# Patient Record
Sex: Male | Born: 1980 | Race: White | Hispanic: No | Marital: Married | Smoking: Former smoker
Health system: Southern US, Community
[De-identification: ages and names within clinical notes are randomized; demographics above are authoritative.]

## PROBLEM LIST (undated history)

## (undated) DIAGNOSIS — K219 Gastro-esophageal reflux disease without esophagitis: Secondary | ICD-10-CM

## (undated) DIAGNOSIS — Z8719 Personal history of other diseases of the digestive system: Secondary | ICD-10-CM

## (undated) HISTORY — DX: Gastro-esophageal reflux disease without esophagitis: K21.9

## (undated) HISTORY — PX: WISDOM TOOTH EXTRACTION: SHX21

---

## 2009-07-25 ENCOUNTER — Emergency Department (HOSPITAL_COMMUNITY): Admission: EM | Admit: 2009-07-25 | Discharge: 2009-07-25 | Payer: Self-pay | Admitting: Emergency Medicine

## 2009-07-25 ENCOUNTER — Encounter (INDEPENDENT_AMBULATORY_CARE_PROVIDER_SITE_OTHER): Payer: Self-pay | Admitting: *Deleted

## 2009-07-26 ENCOUNTER — Telehealth: Payer: Self-pay | Admitting: Gastroenterology

## 2009-07-27 ENCOUNTER — Ambulatory Visit: Payer: Self-pay | Admitting: Gastroenterology

## 2009-07-27 DIAGNOSIS — K219 Gastro-esophageal reflux disease without esophagitis: Secondary | ICD-10-CM

## 2009-07-27 DIAGNOSIS — R1084 Generalized abdominal pain: Secondary | ICD-10-CM | POA: Insufficient documentation

## 2009-07-27 DIAGNOSIS — R935 Abnormal findings on diagnostic imaging of other abdominal regions, including retroperitoneum: Secondary | ICD-10-CM | POA: Insufficient documentation

## 2009-07-27 DIAGNOSIS — K625 Hemorrhage of anus and rectum: Secondary | ICD-10-CM

## 2009-07-30 ENCOUNTER — Ambulatory Visit: Payer: Self-pay | Admitting: Gastroenterology

## 2009-09-07 ENCOUNTER — Ambulatory Visit: Payer: Self-pay | Admitting: Gastroenterology

## 2009-09-16 ENCOUNTER — Telehealth (INDEPENDENT_AMBULATORY_CARE_PROVIDER_SITE_OTHER): Payer: Self-pay | Admitting: *Deleted

## 2010-09-04 ENCOUNTER — Encounter: Payer: Self-pay | Admitting: Gastroenterology

## 2010-09-13 NOTE — Assessment & Plan Note (Signed)
  Review of gastrointestinal problems: 1. Intermittent abdominal pains, fall 2010. Emergency room visit: CT scan abdomen and pelvis suggested possible edema in her right colon, terminal ileum, small associated lymph nodes. White blood cell count 14,000, total bilirubin 1.7 otherwise normal labs. Colonoscopy December, 2010 showed normal terminal ileum, very mildly granular colonic mucosa however biopsies were all normal. No colitis.  January, 2010 Binge drinks, question contribution to his discomforts intermittently.    History of Present Illness Visit Type: Follow-up Visit Primary GI MD: Rob Bunting MD Primary Provider: n/a Chief Complaint: 4-5 week F/U History of Present Illness:     who since colonoscopy last month has done pretty good.  No abd pains.  NO nausea, no vomitting.  No constipation, diarrhrea or bleeding. On sunday he had some loose stools, but this was after 2-3 days of rather heavy drinking with friends in town. (8-9 beers on Frid and Sat nights).           Current Medications (verified): 1)  None  Allergies (verified): No Known Drug Allergies  Vital Signs:  Patient profile:   30 year old male Height:      76 inches Weight:      224.25 pounds BMI:     27 .40 Pulse rate:   64 / minute Pulse rhythm:   regular BP sitting:   108 / 72  (left arm) Cuff size:   regular  Vitals Entered By: June McMurray CMA Duncan Dull) (September 07, 2009 8:30 AM)  Physical Exam  Additional Exam:  Constitutional: generally well appearing Psychiatric: alert and oriented times 3 Abdomen: soft, non-tender, non-distended, normal bowel sounds    Impression & Recommendations:  Problem # 1:  Intermittent abdominal pains unclear etiology. He does binge drink, for example this past weekend he had 8-9 beers both Friday and Saturday night. Sunday he had a lot of loose stools. He did not notice any significant abdominal pains however. Perhaps this is contributing, gastritis intermittently.  Perhaps mild pancreatitis. This was certainly not evident on CT scan previously. I recommended he try cut back on his binge drinking. He will get an abdominal ultrasound to check for colon or pathology (did have slightly elevated bilirubin at his ER visit).  He will call the office at the time of his next episode of abdominal pain, this usually lasts one to 2 days. My plan would be for him to come to the office for examination, repeat labs including a CBC, complete metabolic profile, hepatic enzymes.  Patient Instructions: 1)  You will be scheduled for an ultrasound to check for gallbladder pathology. 2)  Call Dr. Christella Hartigan office if the episodic pains occur again, will plan to get you in office for examination and get repeat bloodwork (cbc, cmet, amylase, lipase) during the episode. 3)  Otherwise follow up as needed. 4)  The medication list was reviewed and reconciled.  All changed / newly prescribed medications were explained.  A complete medication list was provided to the patient / caregiver.  Appended Document: Orders Update/US    Clinical Lists Changes  Orders: Added new Test order of Ultrasound Abdomen (UAS) - Signed

## 2010-09-13 NOTE — Progress Notes (Signed)
Summary: Missed [place] report  Phone Note Outgoing Call Call back at [facility] Phone [phone]   Call placed by: Chales Abrahams CMA Duncan Dull),  September 16, 2009 10:23 AM Summary of Call: called to inquire about missed [place] appt. left message on machine to call back  Initial call taken by: Chales Abrahams CMA Duncan Dull),  September 16, 2009 10:23 AM  Follow-up for Phone Call        pt can not afford to have [place] he cx appt and does not want to reschedule.  He will call if he has any further problems. Follow-up by: Chales Abrahams CMA Duncan Dull),  September 16, 2009 10:40 AM  Additional Follow-up for Phone Call Additional follow up Details #1::        ok Additional Follow-up by: Rachael Fee MD,  September 17, 2009 7:51 AM

## 2010-11-15 LAB — URINALYSIS, ROUTINE W REFLEX MICROSCOPIC
Hgb urine dipstick: NEGATIVE
Ketones, ur: NEGATIVE mg/dL

## 2010-11-15 LAB — CBC
Hemoglobin: 14.5 g/dL (ref 13.0–17.0)
MCHC: 34.3 g/dL (ref 30.0–36.0)
RBC: 4.79 MIL/uL (ref 4.22–5.81)
RDW: 13.8 % (ref 11.5–15.5)

## 2010-11-15 LAB — COMPREHENSIVE METABOLIC PANEL
ALT: 22 U/L (ref 0–53)
Alkaline Phosphatase: 57 U/L (ref 39–117)
CO2: 24 mEq/L (ref 19–32)
Chloride: 106 mEq/L (ref 96–112)
GFR calc non Af Amer: >60 mL/min (ref >60)
Glucose, Bld: 109 mg/dL — ABNORMAL HIGH (ref 70–99)
Potassium: 3.8 mEq/L (ref 3.5–5.1)
Sodium: 139 mEq/L (ref 135–145)

## 2010-11-15 LAB — DIFFERENTIAL
Basophils Relative: 0 % (ref 0–1)
Eosinophils Absolute: 0 10*3/uL (ref 0.0–0.7)
Monocytes Relative: 1 % — ABNORMAL LOW (ref 3–12)
Neutrophils Relative %: 98 % — ABNORMAL HIGH (ref 43–77)

## 2012-08-12 ENCOUNTER — Ambulatory Visit: Payer: PRIVATE HEALTH INSURANCE | Admitting: Physician Assistant

## 2012-08-12 VITALS — BP 104/74 | HR 88 | Temp 98.4°F | Resp 18 | Wt 240.0 lb

## 2012-08-12 DIAGNOSIS — R05 Cough: Secondary | ICD-10-CM

## 2012-08-12 DIAGNOSIS — J069 Acute upper respiratory infection, unspecified: Secondary | ICD-10-CM

## 2012-08-12 LAB — POCT INFLUENZA A/B: Influenza A, POC: NEGATIVE

## 2012-08-12 MED ORDER — HYDROCOD POLST-CHLORPHEN POLST 10-8 MG/5ML PO LQCR: 5.0000 mL | Freq: Two times a day (BID) | ORAL | Status: DC | PRN

## 2012-08-12 MED ORDER — GUAIFENESIN ER 1200 MG PO TB12: 1.0000 | ORAL_TABLET | Freq: Two times a day (BID) | ORAL | Status: DC | PRN

## 2012-08-12 MED ORDER — IPRATROPIUM BROMIDE 0.03 % NA SOLN: 2.0000 | Freq: Two times a day (BID) | NASAL | Status: DC

## 2012-08-12 NOTE — Progress Notes (Signed)
  Subjective:    Patient ID: Damon Richard, male    DOB: Jul 14, 1981, 31 y.o.   MRN: [id number]  HPI This 31 y.o. male presents for evaluation of illness-"flu-like symptoms." Began 08/09/2012 and he reports that he spent 2 days in bed, but "cannot afford to be out of work." He's not able to discern whether the symptoms began suddenly or if the onset was gradual.  Initially he had a sore throat, which has since resolved. Cough, sinus pressure and drainage, blowing out "chunks" from his nose and occasionally coughing them up.  HA.  Subjective fever, chills. Mild aches.  No GI/GU symptoms.   Past Medical History  Diagnosis Date  . GERD (gastroesophageal reflux disease)     History reviewed. No pertinent past surgical history.  Prior to Admission medications   Medication Sig Start Date End Date Taking? Authorizing Provider  omeprazole (PRILOSEC) 20 MG capsule Take 20 mg by mouth daily.   Yes Historical Provider, MD    No Known Allergies  History   Social History  . Marital Status: Single    Spouse Name: Albin Felling    Number of Children: 0  . Years of Education: 16   Occupational History  . beer sales    Social History Main Topics  . Smoking status: Never Smoker   . Smokeless tobacco: Former Neurosurgeon    Types: Chew  . Alcohol Use: 0.6 - 24.0 oz/week    1-40 Cans of beer per week  . Drug Use: No  . Sexually Active: Yes -- Male partner(s)    Birth Control/ Protection: Other-see comments     Comment: partner takes birth control pills   Other Topics Concern  . Not on file   Social History Narrative   Lives with girlfriend.    Family History  Problem Relation Age of Onset  . Cancer Father 67    bladder; non-smoker    Review of Systems As above    Objective:   Physical Exam Blood pressure 104/74, pulse 88, temperature 98.4 F (36.9 C), temperature source Oral, resp. rate 18, weight 240 lb (108.863 kg). There is no height on file to calculate BMI. Well-developed, well  nourished WM who is awake, alert and oriented, in NAD. HEENT: [place]/AT, PERRL, EOMI.  Sclera and conjunctiva are clear.  EAC are patent, TMs are normal in appearance. Nasal mucosa is pink and moist, though somewhat congested. OP is clear. Neck: supple, non-tender, no lymphadenopathy, thyromegaly. Heart: RRR, no murmur Lungs: normal effort, CTA Extremities: no cyanosis, clubbing or edema. Skin: warm and dry without rash. Psychologic: good mood and appropriate affect, normal speech and behavior.  Results for orders placed in visit on 08/12/12  POCT INFLUENZA A/B      Component Value Range   Influenza A, POC Negative     Influenza B, POC Negative         Assessment & Plan:   1. Cough  chlorpheniramine-HYDROcodone (TUSSIONEX PENNKINETIC ER) 10-8 MG/5ML LQCR  2. Acute upper respiratory infections of unspecified site  POCT Influenza A/B, ipratropium (ATROVENT) 0.03 % nasal spray, Guaifenesin (MUCINEX MAXIMUM STRENGTH) 1200 MG TB12   Supportive care.  Anticipatory guidance.  RTC if symptoms worsen or persist.

## 2012-08-12 NOTE — Patient Instructions (Addendum)
Get plenty of rest and drink at least 64 ounces of water daily. 

## 2015-01-19 ENCOUNTER — Encounter: Payer: Self-pay | Admitting: Gastroenterology

## 2015-03-10 ENCOUNTER — Ambulatory Visit (INDEPENDENT_AMBULATORY_CARE_PROVIDER_SITE_OTHER): Payer: Managed Care, Other (non HMO) | Admitting: Emergency Medicine

## 2015-03-10 VITALS — BP 110/60 | HR 91 | Temp 98.1°F | Resp 18 | Ht 75.5 in | Wt 227.4 lb

## 2015-03-10 DIAGNOSIS — S335XXA Sprain of ligaments of lumbar spine, initial encounter: Secondary | ICD-10-CM

## 2015-03-10 DIAGNOSIS — M7022 Olecranon bursitis, left elbow: Secondary | ICD-10-CM

## 2015-03-10 MED ORDER — CYCLOBENZAPRINE HCL 10 MG PO TABS: 10.0000 mg | ORAL_TABLET | Freq: Three times a day (TID) | ORAL | Status: DC | PRN

## 2015-03-10 MED ORDER — CEPHALEXIN 500 MG PO CAPS: 500.0000 mg | ORAL_CAPSULE | Freq: Four times a day (QID) | ORAL | Status: DC

## 2015-03-10 MED ORDER — NAPROXEN SODIUM 550 MG PO TABS: 550.0000 mg | ORAL_TABLET | Freq: Two times a day (BID) | ORAL | Status: DC

## 2015-03-10 MED ORDER — SULFAMETHOXAZOLE-TRIMETHOPRIM 800-160 MG PO TABS: 12.0000 | ORAL_TABLET | Freq: Two times a day (BID) | ORAL | Status: DC

## 2015-03-10 MED ORDER — NAPROXEN SODIUM 550 MG PO TABS: 550.0000 mg | ORAL_TABLET | Freq: Two times a day (BID) | ORAL | Status: AC

## 2015-03-10 NOTE — Progress Notes (Signed)
Subjective:  Patient ID: Damon Richard, male    DOB: 07/16/1981  Age: 34 y.o. MRN: [id number]  CC: Elbow Swollen; Back Pain; and Prescriptions   HPI Damon Richard presents  with acutely swollen and painful red left elbow. This began last night. He has no history of overuse or injury. Said last night he was picking a scab on his elbow and now is so swollen and red and painful. He denies any fever chills no nausea or vomiting. Has had no improvement in symptoms with over-the-counter medication.  He also complains of low back pain after bending over last week and feel hearing a pop. He denies any radicular symptoms numbness tingling or weakness. No radiation of pain improvement with over-the-counter medication has no history of a back injury. Work Leisure centre manager.  History Damon Richard has a past medical history of GERD (gastroesophageal reflux disease).   He has no past surgical history on file.   His  family history includes Cancer (age of onset: 44) in his father.  He   reports that he has never smoked. He has quit using smokeless tobacco. His smokeless tobacco use included Chew. He reports that he drinks about 0.6 - 24.0 oz of alcohol per week. He reports that he does not use illicit drugs.  Outpatient Prescriptions Prior to Visit  Medication Sig Dispense Refill  . chlorpheniramine-HYDROcodone (TUSSIONEX PENNKINETIC ER) 10-8 MG/5ML LQCR Take 5 mLs by mouth every 12 (twelve) hours as needed (cough). (Patient not taking: Reported on 03/10/2015) 140 mL 0  . Guaifenesin (MUCINEX MAXIMUM STRENGTH) 1200 MG TB12 Take 1 tablet (1,200 mg total) by mouth every 12 (twelve) hours as needed. (Patient not taking: Reported on 03/10/2015) 14 tablet 1  . ipratropium (ATROVENT) 0.03 % nasal spray Place 2 sprays into the nose 2 (two) times daily. (Patient not taking: Reported on 03/10/2015) 30 mL 0  . omeprazole (PRILOSEC) 20 MG capsule Take 20 mg by mouth daily.     No facility-administered medications  prior to visit.    History   Social History  . Marital Status: Single    Spouse Name: Damon Richard  . Number of Children: 0  . Years of Education: 16   Occupational History  . beer sales    Social History Main Topics  . Smoking status: Never Smoker   . Smokeless tobacco: Former Neurosurgeon    Types: Chew  . Alcohol Use: 0.6 - 24.0 oz/week    1-40 Cans of beer per week  . Drug Use: No  . Sexual Activity:    Partners: Female    Birth Control/ Protection: Other-see comments     Comment: partner takes birth control pills   Other Topics Concern  . None   Social History Narrative   Lives with girlfriend.     Review of Systems  Constitutional: Negative for fever, chills and appetite change.  HENT: Negative for congestion, ear pain, postnasal drip, sinus pressure and sore throat.   Eyes: Negative for pain and redness.  Respiratory: Negative for cough, shortness of breath and wheezing.   Cardiovascular: Negative for leg swelling.  Gastrointestinal: Negative for nausea, vomiting, abdominal pain, diarrhea, constipation and blood in stool.  Endocrine: Negative for polyuria.  Genitourinary: Negative for dysuria, urgency, frequency and flank pain.  Musculoskeletal: Negative for gait problem.  Skin: Negative for rash.  Neurological: Negative for weakness and headaches.  Psychiatric/Behavioral: Negative for confusion and decreased concentration. The patient is not nervous/anxious.     Objective:  BP 110/60 mmHg  Pulse 91  Temp(Src) 98.1 F (36.7 C) (Oral)  Resp 18  Ht 6' 3.5" (1.918 m)  Wt 227 lb 6.4 oz (103.148 kg)  BMI 28.04 kg/m2  SpO2 98%  Physical Exam  Constitutional: He is oriented to person, place, and time. He appears well-developed and well-nourished. No distress.  HENT:  Head: Normocephalic and atraumatic.  Right Ear: External ear normal.  Left Ear: External ear normal.  Nose: Nose normal.  Eyes: Conjunctivae and EOM are normal. Pupils are equal, round, and reactive to  light. No scleral icterus.  Neck: Normal range of motion. Neck supple. No tracheal deviation present.  Cardiovascular: Normal rate, regular rhythm and normal heart sounds.   Pulmonary/Chest: Effort normal. No respiratory distress. He has no wheezes. He has no rales.  Abdominal: He exhibits no mass. There is no tenderness. There is no rebound and no guarding.  Musculoskeletal: He exhibits no edema.       Left elbow: He exhibits swelling. Tenderness found.  Lymphadenopathy:    He has no cervical adenopathy.  Neurological: He is alert and oriented to person, place, and time. Coordination normal.  Skin: Skin is warm and dry. No rash noted. There is erythema.  Psychiatric: He has a normal mood and affect. His behavior is normal.   he was noted to have moderate swelling of the left olecranon bursa. Rather red and swollen tender. Has some lymphangitis.    Assessment & Plan:   Damon Richard was seen today for elbow swollen, back pain and prescriptions.  Diagnoses and all orders for this visit:  Olecranon bursitis, left  Sprain of lumbar region, initial encounter  Other orders -     Discontinue: sulfamethoxazole-trimethoprim (BACTRIM DS,SEPTRA DS) 800-160 MG per tablet; Take 12 tablets by mouth 2 (two) times daily. -     Discontinue: cephALEXin (KEFLEX) 500 MG capsule; Take 1 capsule (500 mg total) by mouth 4 (four) times daily. -     Discontinue: naproxen sodium (ANAPROX DS) 550 MG tablet; Take 1 tablet (550 mg total) by mouth 2 (two) times daily with a meal. -     Discontinue: cyclobenzaprine (FLEXERIL) 10 MG tablet; Take 1 tablet (10 mg total) by mouth 3 (three) times daily as needed for muscle spasms. -     sulfamethoxazole-trimethoprim (BACTRIM DS,SEPTRA DS) 800-160 MG per tablet; Take 12 tablets by mouth 2 (two) times daily. -     naproxen sodium (ANAPROX DS) 550 MG tablet; Take 1 tablet (550 mg total) by mouth 2 (two) times daily with a meal. -     cyclobenzaprine (FLEXERIL) 10 MG tablet;  Take 1 tablet (10 mg total) by mouth 3 (three) times daily as needed for muscle spasms. -     cephALEXin (KEFLEX) 500 MG capsule; Take 1 capsule (500 mg total) by mouth 4 (four) times daily.   I am having [name] maintain his omeprazole, ipratropium, Guaifenesin, chlorpheniramine-HYDROcodone, sulfamethoxazole-trimethoprim, naproxen sodium, cyclobenzaprine, and cephALEXin.  Meds ordered this encounter  Medications  . DISCONTD: sulfamethoxazole-trimethoprim (BACTRIM DS,SEPTRA DS) 800-160 MG per tablet    Sig: Take 12 tablets by mouth 2 (two) times daily.    Dispense:  40 tablet    Refill:  0  . DISCONTD: cephALEXin (KEFLEX) 500 MG capsule    Sig: Take 1 capsule (500 mg total) by mouth 4 (four) times daily.    Dispense:  40 capsule    Refill:  0  . DISCONTD: naproxen sodium (ANAPROX DS) 550 MG tablet    Sig: Take 1 tablet (  550 mg total) by mouth 2 (two) times daily with a meal.    Dispense:  40 tablet    Refill:  0  . DISCONTD: cyclobenzaprine (FLEXERIL) 10 MG tablet    Sig: Take 1 tablet (10 mg total) by mouth 3 (three) times daily as needed for muscle spasms.    Dispense:  30 tablet    Refill:  0  . sulfamethoxazole-trimethoprim (BACTRIM DS,SEPTRA DS) 800-160 MG per tablet    Sig: Take 12 tablets by mouth 2 (two) times daily.    Dispense:  40 tablet    Refill:  0  . naproxen sodium (ANAPROX DS) 550 MG tablet    Sig: Take 1 tablet (550 mg total) by mouth 2 (two) times daily with a meal.    Dispense:  40 tablet    Refill:  0  . cyclobenzaprine (FLEXERIL) 10 MG tablet    Sig: Take 1 tablet (10 mg total) by mouth 3 (three) times daily as needed for muscle spasms.    Dispense:  30 tablet    Refill:  0  . cephALEXin (KEFLEX) 500 MG capsule    Sig: Take 1 capsule (500 mg total) by mouth 4 (four) times daily.    Dispense:  40 capsule    Refill:  0    Appropriate red flag conditions were discussed with the patient as well as actions that should be taken.  Patient expressed his  understanding.  Follow-up: Return if symptoms worsen or fail to improve.  Carmelina Dane, MD

## 2015-03-10 NOTE — Patient Instructions (Signed)
Lumbosacral Strain Lumbosacral strain is a strain of any of the parts that make up your lumbosacral vertebrae. Your lumbosacral vertebrae are the bones that make up the lower third of your backbone. Your lumbosacral vertebrae are held together by muscles and tough, fibrous tissue (ligaments).  CAUSES  A sudden blow to your back can cause lumbosacral strain. Also, anything that causes an excessive stretch of the muscles in the low back can cause this strain. This is typically seen when people exert themselves strenuously, fall, lift heavy objects, bend, or crouch repeatedly. RISK FACTORS  Physically demanding work.  Participation in pushing or pulling sports or sports that require a sudden twist of the back (tennis, golf, baseball).  Weight lifting.  Excessive lower back curvature.  Forward-tilted pelvis.  Weak back or abdominal muscles or both.  Tight hamstrings. SIGNS AND SYMPTOMS  Lumbosacral strain may cause pain in the area of your injury or pain that moves (radiates) down your leg.  DIAGNOSIS Your health care provider can often diagnose lumbosacral strain through a physical exam. In some cases, you may need tests such as X-ray exams.  TREATMENT  Treatment for your lower back injury depends on many factors that your clinician will have to evaluate. However, most treatment will include the use of anti-inflammatory medicines. HOME CARE INSTRUCTIONS   Avoid hard physical activities (tennis, racquetball, waterskiing) if you are not in proper physical condition for it. This may aggravate or create problems.  If you have a back problem, avoid sports requiring sudden body movements. Swimming and walking are generally safer activities.  Maintain good posture.  Maintain a healthy weight.  For acute conditions, you may put ice on the injured area.  Put ice in a plastic bag.  Place a towel between your skin and the bag.  Leave the ice on for 20 minutes, 2-3 times a day.  When the  low back starts healing, stretching and strengthening exercises may be recommended. SEEK MEDICAL CARE IF:  Your back pain is getting worse.  You experience severe back pain not relieved with medicines. SEEK IMMEDIATE MEDICAL CARE IF:   You have numbness, tingling, weakness, or problems with the use of your arms or legs.  There is a change in bowel or bladder control.  You have increasing pain in any area of the body, including your belly (abdomen).  You notice shortness of breath, dizziness, or feel faint.  You feel sick to your stomach (nauseous), are throwing up (vomiting), or become sweaty.  You notice discoloration of your toes or legs, or your feet get very cold. MAKE SURE YOU:   Understand these instructions.  Will watch your condition.  Will get help right away if you are not doing well or get worse. Document Released: 05/10/2005 Document Revised: 08/05/2013 Document Reviewed: 03/19/2013 [facility] [place] [facility] Patient Information 2015 [place], [place]. This information is not intended to replace advice given to you by your health care provider. Make sure you discuss any questions you have with your health care provider. Olecranon Bursitis Bursitis is swelling and soreness (inflammation) of a fluid-filled sac (bursa) that covers and protects a joint. Olecranon bursitis occurs over the elbow.  CAUSES Bursitis can be caused by injury, overuse of the joint, arthritis, or infection.  SYMPTOMS   Tenderness, swelling, warmth, or redness over the elbow.  Elbow pain with movement. This is greater with bending the elbow.  Squeaking sound when the bursa is rubbed or moved.  Increasing size of the bursa without pain or discomfort.  Fever  with increasing pain and swelling if the bursa becomes infected. HOME CARE INSTRUCTIONS   Put ice on the affected area.  Put ice in a plastic bag.  Place a towel between your skin and the bag.  Leave the ice on for 15-20 minutes each hour while awake.  Do this for the first 2 days.  When resting, elevate your elbow above the level of your heart. This helps reduce swelling.  Continue to put the joint through a full range of motion 4 times per day. Rest the injured joint at other times. When the pain lessens, begin normal slow movements and usual activities.  Only take over-the-counter or prescription medicines for pain, discomfort, or fever as directed by your caregiver.  Reduce your intake of milk and related dairy products (cheese, yogurt). They may make your condition worse. SEEK IMMEDIATE MEDICAL CARE IF:   Your pain increases even during treatment.  You have a fever.  You have heat and inflammation over the bursa and elbow.  You have a red line that goes up your arm.  You have pain with movement of your elbow. MAKE SURE YOU:   Understand these instructions.  Will watch your condition.  Will get help right away if you are not doing well or get worse. Document Released: 08/30/2006 Document Revised: 10/23/2011 Document Reviewed: 07/16/2007 [facility] Patient Information 2015 [place], [place]. This information is not intended to replace advice given to you by your health care provider. Make sure you discuss any questions you have with your health care provider.

## 2019-04-28 ENCOUNTER — Other Ambulatory Visit: Payer: Self-pay

## 2019-04-28 DIAGNOSIS — Z20822 Contact with and (suspected) exposure to covid-19: Secondary | ICD-10-CM

## 2019-04-29 LAB — NOVEL CORONAVIRUS, NAA: SARS-CoV-2, NAA: DETECTED — AB

## 2019-06-15 ENCOUNTER — Encounter: Payer: Self-pay | Admitting: Emergency Medicine

## 2019-06-15 ENCOUNTER — Other Ambulatory Visit: Payer: Self-pay

## 2019-06-15 ENCOUNTER — Emergency Department
Admission: EM | Admit: 2019-06-15 | Discharge: 2019-06-15 | Disposition: A | Discharge status: Home / Self Care | Payer: Commercial Managed Care - PPO | Attending: Emergency Medicine | Admitting: Emergency Medicine

## 2019-06-15 ENCOUNTER — Emergency Department: Payer: Commercial Managed Care - PPO

## 2019-06-15 DIAGNOSIS — Y929 Unspecified place or not applicable: Secondary | ICD-10-CM | POA: Diagnosis not present

## 2019-06-15 DIAGNOSIS — Z87891 Personal history of nicotine dependence: Secondary | ICD-10-CM | POA: Insufficient documentation

## 2019-06-15 DIAGNOSIS — Z79899 Other long term (current) drug therapy: Secondary | ICD-10-CM | POA: Insufficient documentation

## 2019-06-15 DIAGNOSIS — X509XXA Other and unspecified overexertion or strenuous movements or postures, initial encounter: Secondary | ICD-10-CM | POA: Insufficient documentation

## 2019-06-15 DIAGNOSIS — Y999 Unspecified external cause status: Secondary | ICD-10-CM | POA: Insufficient documentation

## 2019-06-15 DIAGNOSIS — S8992XA Unspecified injury of left lower leg, initial encounter: Secondary | ICD-10-CM | POA: Diagnosis present

## 2019-06-15 DIAGNOSIS — Y9389 Activity, other specified: Secondary | ICD-10-CM | POA: Insufficient documentation

## 2019-06-15 DIAGNOSIS — M25462 Effusion, left knee: Secondary | ICD-10-CM | POA: Diagnosis not present

## 2019-06-15 MED ORDER — IBUPROFEN 600 MG PO TABS: 600.0000 mg | ORAL_TABLET | Freq: Four times a day (QID) | ORAL | 0 refills | Status: DC | PRN

## 2019-06-15 NOTE — Discharge Instructions (Signed)
Your xray shows no fracture. You have swelling inside your joint so you may have injured your meniscus or ligament. Please wear knee brace and use crutches.  Ice and elevate knee tonight.  Take ibuprofen for pain and inflammation.  Please call orthopedics tomorrow for an appointment this week.

## 2019-06-15 NOTE — ED Notes (Signed)
Patient states he was doing some jumping and felt a pop in the left knee with immediate pain. PA in to see patient before this RN on shift.

## 2019-06-15 NOTE — ED Provider Notes (Signed)
[facility] Emergency Department Provider Note  ____________________________________________  Time seen: Approximately 7:52 PM  I have reviewed the triage vital signs and the nursing notes.   HISTORY  Chief Complaint Knee Pain    HPI Damon Richard is a 38 y.o. male that presents to the emergency department for evaluation of left knee pain after injury tonight.  Patient was doing a bet and was jumping when he heard a pop to his left knee.  He has had difficulty walking on knee since injury.  Past Medical History:  Diagnosis Date  . GERD (gastroesophageal reflux disease)     Patient Active Problem List   Diagnosis Date Noted  . GERD 07/27/2009  . RECTAL BLEEDING 07/27/2009  . ABDOMINAL PAIN -GENERALIZED 07/27/2009  . NONSPEC ABN FINDNG RAD & OTH EXAM ABDOMINAL AREA 07/27/2009    History reviewed. No pertinent surgical history.  Prior to Admission medications   Medication Sig Start Date End Date Taking? Authorizing Provider  cephALEXin (KEFLEX) 500 MG capsule Take 1 capsule (500 mg total) by mouth 4 (four) times daily. 03/10/15   Carmelina DaneAnderson, Jeffery S, MD  chlorpheniramine-HYDROcodone [facility](TUSSIONEX PENNKINETIC ER) 10-8 MG/5ML LQCR Take 5 mLs by mouth every 12 (twelve) hours as needed (cough). Patient not taking: Reported on 03/10/2015 08/12/12   Porfirio OarJeffery, Chelle, PA  cyclobenzaprine (FLEXERIL) 10 MG tablet Take 1 tablet (10 mg total) by mouth 3 (three) times daily as needed for muscle spasms. 03/10/15   Carmelina DaneAnderson, Jeffery S, MD  Guaifenesin [facility](MUCINEX MAXIMUM STRENGTH) 1200 MG TB12 Take 1 tablet (1,200 mg total) by mouth every 12 (twelve) hours as needed. Patient not taking: Reported on 03/10/2015 08/12/12   Porfirio OarJeffery, Chelle, PA  ibuprofen (ADVIL) 600 MG tablet Take 1 tablet (600 mg total) by mouth every 6 (six) hours as needed. 06/15/19   Enid DerryWagner, Nil Bolser, PA-C  ipratropium (ATROVENT) 0.03 % nasal spray Place 2 sprays into the nose 2 (two) times daily. Patient not  taking: Reported on 03/10/2015 08/12/12   Porfirio OarJeffery, Chelle, PA  omeprazole (PRILOSEC) 20 MG capsule Take 20 mg by mouth daily.    [provider]  sulfamethoxazole-trimethoprim (BACTRIM DS,SEPTRA DS) 800-160 MG per tablet Take 12 tablets by mouth 2 (two) times daily. 03/10/15   Carmelina DaneAnderson, Jeffery S, MD    Allergies Patient has no known allergies.  Family History  Problem Relation Age of Onset  . Cancer Father [id number]       bladder; non-smoker    Social History Social History   Tobacco Use  . Smoking status: Never Smoker  . Smokeless tobacco: Former NeurosurgeonUser    Types: Chew  Substance Use Topics  . Alcohol use: Yes    Alcohol/week: 1.0 - 40.0 standard drinks    Types: 1 - 40 Cans of beer per week  . Drug use: No     Review of Systems   Cardiovascular: No chest pain. Respiratory: No SOB. Gastrointestinal: No nausea, no vomiting.  Musculoskeletal: Positive for knee pain. Skin: Negative for rash, abrasions, lacerations, ecchymosis.   ____________________________________________   PHYSICAL EXAM:  VITAL SIGNS: ED Triage Vitals  Enc Vitals Group     BP 06/15/19 1807 110/76     Pulse Rate 06/15/19 1807 78     Resp 06/15/19 1807 16     Temp 06/15/19 1807 99 F (37.2 C)     Temp Source 06/15/19 1807 Oral     SpO2 06/15/19 1807 100 %     Weight 06/15/19 1809 230 lb (104.3 kg)  Height 06/15/19 1809 6\' 4"  (1.93 m)     Head Circumference --      Peak Flow --      Pain Score 06/15/19 1809 5     Pain Loc --      Pain Edu? --      Excl. in GC? --      Constitutional: Alert and oriented. Well appearing and in no acute distress. Eyes: Conjunctivae are normal. PERRL. EOMI. Head: Atraumatic. ENT:      Ears:      Nose: No congestion/rhinnorhea.      Mouth/Throat: Mucous membranes are moist.  Neck: No stridor.  Cardiovascular: Normal rate, regular rhythm.  Good peripheral circulation. Respiratory: Normal respiratory effort without tachypnea or retractions. Lungs CTAB.  Good air entry to the bases with no decreased or absent breath sounds. Musculoskeletal: Full range of motion to all extremities. No gross deformities appreciated.  Full active range of motion of left knee.  Tenderness to medial knee. Negative anterior drawer, posterior drawer, valgus, varus, mcMurray, patella apprehension, apley grind. Neurologic:  Normal speech and language. No gross focal neurologic deficits are appreciated.  Skin:  Skin is warm, dry and intact. No rash noted. Psychiatric: Mood and affect are normal. Speech and behavior are normal. Patient exhibits appropriate insight and judgement.   ____________________________________________   LABS (all labs ordered are listed, but only abnormal results are displayed)  Labs Reviewed - No data to display ____________________________________________  EKG   ____________________________________________  RADIOLOGY 13/01/20, personally viewed and evaluated these images (plain radiographs) as part of my medical decision making, as well as reviewing the written report by the radiologist.  Dg Knee Complete 4 Views Left  Result Date: 06/15/2019 CLINICAL DATA:  Initial evaluation for acute left knee pain status post injury. EXAM: LEFT KNEE - COMPLETE 4+ VIEW COMPARISON:  None. FINDINGS: No acute fracture or dislocation. Moderate effusion seen within the suprapatellar recess. Osseous mineralization normal. No discrete osseous lesions. Visualized soft tissues within normal limits. IMPRESSION: 1. Moderate joint effusion within the suprapatellar recess. 2. No acute osseous abnormality. Electronically Signed   By: 13/08/2018 M.D.   On: 06/15/2019 19:17    ____________________________________________    PROCEDURES  Procedure(s) performed:    Procedures    Medications - No data to display   ____________________________________________   INITIAL IMPRESSION / ASSESSMENT AND PLAN / ED COURSE  Pertinent labs &  imaging results that were available during my care of the patient were reviewed by me and considered in my medical decision making (see chart for details).  Review of the [place] CSRS was performed in accordance of the [facility] prior to dispensing any controlled drugs.     Patient presented to emergency department for evaluation of knee injury.  Vital signs and exam are reassuring.  Knee x-ray consistent with joint effusion.  Knee brace was placed.  Crutches were given.  Patient will be discharged home with prescriptions for Motrin. Patient is to follow up with orthopedics as directed. Patient is given ED precautions to return to the ED for any worsening or new symptoms.  Damon Richard was evaluated in Emergency Department on 06/15/2019 for the symptoms described in the history of present illness. He was evaluated in the context of the global COVID-19 pandemic, which necessitated consideration that the patient might be at risk for infection with the SARS-CoV-2 virus that causes COVID-19. Institutional protocols and algorithms that pertain to the evaluation of patients at risk for COVID-19 are in a  state of rapid change based on information released by regulatory bodies including the CDC and federal and state organizations. These policies and algorithms were followed during the patient's care in the ED.   ____________________________________________  FINAL CLINICAL IMPRESSION(S) / ED DIAGNOSES  Final diagnoses:  Effusion of left knee  Injury of left knee, initial encounter      NEW MEDICATIONS STARTED DURING THIS VISIT:  ED Discharge Orders         Ordered    ibuprofen (ADVIL) 600 MG tablet  Every 6 hours PRN     06/15/19 2042              This chart was dictated using voice recognition software/Dragon. Despite best efforts to proofread, errors can occur which can change the meaning. Any change was purely unintentional.    Laban Emperor, PA-C 06/15/19 2227    Vanessa Needmore,  MD 06/16/19 [phone]

## 2019-06-15 NOTE — ED Notes (Signed)
Knee immobilizer placed. Crutches fit to 6'3" and patient able to walk with return demonstration.

## 2019-06-15 NOTE — ED Triage Notes (Signed)
Pt to ED via POV c/o left knee pain. Pt states that he was "jumping and kicking" and landed wrong on his knee. Pt states that he heard loud "pops". Pt is unable to bear weight due to pain.

## 2019-07-04 DIAGNOSIS — M25662 Stiffness of left knee, not elsewhere classified: Secondary | ICD-10-CM | POA: Insufficient documentation

## 2019-07-04 DIAGNOSIS — M25562 Pain in left knee: Secondary | ICD-10-CM | POA: Insufficient documentation

## 2019-08-01 ENCOUNTER — Other Ambulatory Visit: Payer: Self-pay

## 2019-08-01 ENCOUNTER — Encounter
Admission: RE | Admit: 2019-08-01 | Discharge: 2019-08-01 | Disposition: A | Discharge status: Home / Self Care | Payer: Commercial Managed Care - PPO | Source: Ambulatory Visit | Attending: Orthopedic Surgery | Admitting: Orthopedic Surgery

## 2019-08-01 ENCOUNTER — Other Ambulatory Visit: Payer: Self-pay | Admitting: Orthopedic Surgery

## 2019-08-01 DIAGNOSIS — Z01818 Encounter for other preprocedural examination: Secondary | ICD-10-CM | POA: Insufficient documentation

## 2019-08-01 HISTORY — DX: Personal history of other diseases of the digestive system: Z87.19

## 2019-08-01 NOTE — Patient Instructions (Signed)
Your procedure is scheduled on: 08-14-19 THURSDAY Report to Same Day Surgery 2nd floor medical mall [facility] Entrance-take elevator on left to 2nd floor.  Check in with surgery information desk.) To find out your arrival time please call [phone] between 1PM - 3PM on 08-13-19 [facility]  Remember: Instructions that are not followed completely may result in serious medical risk, up to and including death, or upon the discretion of your surgeon and anesthesiologist your surgery may need to be rescheduled.    _x___ 1. Do not eat food after midnight the night before your procedure. NO GUM OR CANDY AFTER MIDNIGHT. You may drink clear liquids up to 2 hours before you are scheduled to arrive at the hospital for your procedure.  Do not drink clear liquids within 2 hours of your scheduled arrival to the hospital.  Clear liquids include  --Water or Apple juice without pulp  --Gatorade  --Black Coffee or Clear Tea (No milk, no creamers, do not add anything to the coffee or Tea   ____Ensure clear carbohydrate drink on the way to the hospital for bariatric patients  ____Ensure clear carbohydrate drink 3 hours before surgery.    __x__ 2. No Alcohol for 24 hours before or after surgery.   __x__3. No Smoking or e-cigarettes for 24 prior to surgery.  Do not use any chewable tobacco products for at least 6 hour prior to surgery   ____  4. Bring all medications with you on the day of surgery if instructed.    __x__ 5. Notify your doctor if there is any change in your medical condition     (cold, fever, infections).    x___6. On the morning of surgery brush your teeth with toothpaste and water.  You may rinse your mouth with mouth wash if you wish.  Do not swallow any toothpaste or mouthwash.   Do not wear jewelry, make-up, hairpins, clips or nail polish.  Do not wear lotions, powders, or perfumes.   Do not shave 48 hours prior to surgery. Men may shave face and neck.  Do not bring valuables  to the hospital.    [facility] is not responsible for any belongings or valuables.               Contacts, dentures or bridgework may not be worn into surgery.  Leave your suitcase in the car. After surgery it may be brought to your room.  For patients admitted to the hospital, discharge time is determined by your treatment team.  _  Patients discharged the day of surgery will not be allowed to drive home.  You will need someone to drive you home and stay with you the night of your procedure.    Please read over the following fact sheets that you were given:   [facility] Preparing for Surgery and or MRSA Information   _x___ TAKE THE FOLLOWING MEDICATION THE MORNING OF SURGERY WITH A SMALL SIP OF WATER. These include:  1. PRILOSEC (OMEPRAZOLE)  2. TAKE A PRILOSEC THE NIGHT BEFORE YOUR SURGERY  3.  4.  5.  6.  ____Fleets enema or Magnesium Citrate as directed.   _x___ Use CHG Soap or sage wipes as directed on instruction sheet   ____ Use inhalers on the day of surgery and bring to hospital day of surgery  ____ Stop Metformin and Janumet 2 days prior to surgery.    ____ Take 1/2 of usual insulin dose the night before surgery and none on the morning  surgery.   ____ Follow recommendations from Cardiologist, Pulmonologist or PCP regarding stopping Aspirin, Coumadin, Plavix ,Eliquis, Effient, or Pradaxa, and Pletal.  X____Stop Anti-inflammatories such as Advil, Aleve, Ibuprofen, Motrin, Naproxen, Naprosyn, Goodies powders or aspirin products 7 DAYS PRIOR TO SURGERY-OK to take Tylenol   ____ Stop supplements until after surgery.     ____ Bring C-Pap to the hospital.

## 2019-08-11 ENCOUNTER — Other Ambulatory Visit: Payer: Self-pay

## 2019-08-11 ENCOUNTER — Other Ambulatory Visit
Admission: RE | Admit: 2019-08-11 | Discharge: 2019-08-11 | Disposition: A | Discharge status: Home / Self Care | Payer: Commercial Managed Care - PPO | Source: Ambulatory Visit | Attending: Orthopedic Surgery | Admitting: Orthopedic Surgery

## 2019-08-11 DIAGNOSIS — Z01812 Encounter for preprocedural laboratory examination: Secondary | ICD-10-CM | POA: Diagnosis present

## 2019-08-11 DIAGNOSIS — Z20828 Contact with and (suspected) exposure to other viral communicable diseases: Secondary | ICD-10-CM | POA: Insufficient documentation

## 2019-08-11 LAB — CBC WITH DIFFERENTIAL/PLATELET
Abs Immature Granulocytes: 0.02 10*3/uL (ref 0.00–0.07)
Basophils Absolute: 0.1 10*3/uL (ref 0.0–0.1)
Basophils Relative: 1 %
Eosinophils Absolute: 0.5 10*3/uL (ref 0.0–0.5)
Eosinophils Relative: 7 %
HCT: 41.4 % (ref 39.0–52.0)
Hemoglobin: 13.2 g/dL (ref 13.0–17.0)
Immature Granulocytes: 0 %
Lymphocytes Relative: 28 %
Lymphs Abs: 1.8 10*3/uL (ref 0.7–4.0)
MCH: 26.6 pg (ref 26.0–34.0)
MCHC: 31.9 g/dL (ref 30.0–36.0)
MCV: 83.5 fL (ref 80.0–100.0)
Monocytes Absolute: 0.4 10*3/uL (ref 0.1–1.0)
Monocytes Relative: 7 %
Neutro Abs: 3.6 10*3/uL (ref 1.7–7.7)
Neutrophils Relative %: 57 %
Platelets: 259 10*3/uL (ref 150–400)
RBC: 4.96 MIL/uL (ref 4.22–5.81)
RDW: 14.6 % (ref 11.5–15.5)
WBC: 6.3 10*3/uL (ref 4.0–10.5)
nRBC: 0 % (ref 0.0–0.2)

## 2019-08-11 LAB — BASIC METABOLIC PANEL
Anion gap: 10 (ref 5–15)
BUN: 22 mg/dL — ABNORMAL HIGH (ref 6–20)
CO2: 25 mmol/L (ref 22–32)
Calcium: 9.1 mg/dL (ref 8.9–10.3)
Chloride: 104 mmol/L (ref 98–111)
Creatinine, Ser: 1.13 mg/dL (ref 0.61–1.24)
GFR calc Af Amer: >60 mL/min (ref >60)
GFR calc non Af Amer: >60 mL/min (ref >60)
Glucose, Bld: 97 mg/dL (ref 70–99)
Potassium: 4.2 mmol/L (ref 3.5–5.1)
Sodium: 139 mmol/L (ref 135–145)

## 2019-08-11 LAB — APTT: aPTT: 28 seconds (ref 24–36)

## 2019-08-11 LAB — PROTIME-INR
INR: 1 (ref 0.8–1.2)
Prothrombin Time: 12.9 seconds (ref 11.4–15.2)

## 2019-08-12 LAB — SARS CORONAVIRUS 2 (TAT 6-24 HRS): SARS Coronavirus 2: NEGATIVE

## 2019-08-13 MED ORDER — CEFAZOLIN SODIUM-DEXTROSE 2-4 GM/100ML-% IV SOLN
2.0000 g | INTRAVENOUS | Status: AC
  Administered 2019-08-14: 14:00:00 2 g via INTRAVENOUS

## 2019-08-13 MED ORDER — METRONIDAZOLE IN NACL 5-0.79 MG/ML-% IV SOLN
500.0000 mg | INTRAVENOUS | Status: AC
  Administered 2019-08-14: 14:00:00 500 mg via INTRAVENOUS
  Filled 2019-08-13: qty 100

## 2019-08-14 ENCOUNTER — Other Ambulatory Visit: Payer: Self-pay

## 2019-08-14 ENCOUNTER — Ambulatory Visit
Admission: RE | Admit: 2019-08-14 | Discharge: 2019-08-14 | Disposition: A | Discharge status: Home / Self Care | Payer: Commercial Managed Care - PPO | Source: Ambulatory Visit | Attending: Orthopedic Surgery | Admitting: Orthopedic Surgery

## 2019-08-14 ENCOUNTER — Ambulatory Visit: Payer: Commercial Managed Care - PPO | Admitting: Anesthesiology

## 2019-08-14 ENCOUNTER — Encounter
Admission: RE | Disposition: A | Discharge status: Home / Self Care | Payer: Self-pay | Source: Ambulatory Visit | Attending: Orthopedic Surgery

## 2019-08-14 ENCOUNTER — Encounter: Payer: Self-pay | Admitting: Orthopedic Surgery

## 2019-08-14 DIAGNOSIS — K219 Gastro-esophageal reflux disease without esophagitis: Secondary | ICD-10-CM | POA: Diagnosis not present

## 2019-08-14 DIAGNOSIS — S83512A Sprain of anterior cruciate ligament of left knee, initial encounter: Secondary | ICD-10-CM | POA: Diagnosis present

## 2019-08-14 DIAGNOSIS — S83242A Other tear of medial meniscus, current injury, left knee, initial encounter: Secondary | ICD-10-CM | POA: Insufficient documentation

## 2019-08-14 DIAGNOSIS — X58XXXA Exposure to other specified factors, initial encounter: Secondary | ICD-10-CM | POA: Diagnosis not present

## 2019-08-14 DIAGNOSIS — Z87891 Personal history of nicotine dependence: Secondary | ICD-10-CM | POA: Diagnosis not present

## 2019-08-14 DIAGNOSIS — M659 Synovitis and tenosynovitis, unspecified: Secondary | ICD-10-CM | POA: Diagnosis not present

## 2019-08-14 HISTORY — PX: ANTERIOR CRUCIATE LIGAMENT REPAIR: SHX115

## 2019-08-14 SURGERY — RECONSTRUCTION, KNEE, ACL
Anesthesia: General | Site: Knee | Laterality: Left

## 2019-08-14 MED ORDER — MIDAZOLAM HCL 2 MG/2ML IJ SOLN
INTRAMUSCULAR | Status: AC
  Filled 2019-08-14: qty 2

## 2019-08-14 MED ORDER — DEXAMETHASONE SODIUM PHOSPHATE 10 MG/ML IJ SOLN
INTRAMUSCULAR | Status: AC
  Filled 2019-08-14: qty 1

## 2019-08-14 MED ORDER — DEXAMETHASONE SODIUM PHOSPHATE 10 MG/ML IJ SOLN
INTRAMUSCULAR | Status: DC | PRN
  Administered 2019-08-14: 10 mg via INTRAVENOUS

## 2019-08-14 MED ORDER — SUGAMMADEX SODIUM 200 MG/2ML IV SOLN
INTRAVENOUS | Status: AC
  Filled 2019-08-14: qty 2

## 2019-08-14 MED ORDER — EPINEPHRINE PF 1 MG/ML IJ SOLN
INTRAMUSCULAR | Status: AC
  Filled 2019-08-14: qty 4

## 2019-08-14 MED ORDER — BUPIVACAINE HCL (PF) 0.25 % IJ SOLN
INTRAMUSCULAR | Status: DC | PRN
  Administered 2019-08-14: 30 mL

## 2019-08-14 MED ORDER — EPINEPHRINE PF 1 MG/ML IJ SOLN
INTRAMUSCULAR | Status: DC | PRN
  Administered 2019-08-14: 4 mL

## 2019-08-14 MED ORDER — SUCCINYLCHOLINE CHLORIDE 20 MG/ML IJ SOLN
INTRAMUSCULAR | Status: DC | PRN
  Administered 2019-08-14: 100 mg via INTRAVENOUS

## 2019-08-14 MED ORDER — ACETAMINOPHEN 10 MG/ML IV SOLN
INTRAVENOUS | Status: DC | PRN
  Administered 2019-08-14: 1000 mg via INTRAVENOUS

## 2019-08-14 MED ORDER — PROPOFOL 10 MG/ML IV BOLUS
INTRAVENOUS | Status: DC | PRN
  Administered 2019-08-14: 200 mg via INTRAVENOUS

## 2019-08-14 MED ORDER — ACETAMINOPHEN 10 MG/ML IV SOLN
INTRAVENOUS | Status: AC
  Filled 2019-08-14: qty 100

## 2019-08-14 MED ORDER — FENTANYL CITRATE (PF) 100 MCG/2ML IJ SOLN
INTRAMUSCULAR | Status: AC
  Administered 2019-08-14: 50 ug via INTRAVENOUS
  Filled 2019-08-14: qty 2

## 2019-08-14 MED ORDER — MIDAZOLAM HCL 2 MG/2ML IJ SOLN
INTRAMUSCULAR | Status: DC | PRN
  Administered 2019-08-14: 2 mg via INTRAVENOUS

## 2019-08-14 MED ORDER — CEFAZOLIN SODIUM-DEXTROSE 2-4 GM/100ML-% IV SOLN
INTRAVENOUS | Status: AC
  Filled 2019-08-14: qty 100

## 2019-08-14 MED ORDER — ONDANSETRON HCL 4 MG/2ML IJ SOLN
INTRAMUSCULAR | Status: AC
  Administered 2019-08-14: 4 mg via INTRAVENOUS
  Filled 2019-08-14: qty 2

## 2019-08-14 MED ORDER — LACTATED RINGERS IV SOLN: INTRAVENOUS | Status: DC

## 2019-08-14 MED ORDER — OXYCODONE HCL 5 MG PO TABS
ORAL_TABLET | ORAL | Status: AC
  Administered 2019-08-14: 5 mg via ORAL
  Filled 2019-08-14: qty 1

## 2019-08-14 MED ORDER — CHLORHEXIDINE GLUCONATE CLOTH 2 % EX PADS
6.0000 | MEDICATED_PAD | Freq: Once | CUTANEOUS | Status: AC
  Administered 2019-08-14: 6 via TOPICAL

## 2019-08-14 MED ORDER — ROCURONIUM BROMIDE 50 MG/5ML IV SOLN
INTRAVENOUS | Status: AC
  Filled 2019-08-14: qty 1

## 2019-08-14 MED ORDER — GLYCOPYRROLATE 0.2 MG/ML IJ SOLN
INTRAMUSCULAR | Status: AC
  Filled 2019-08-14: qty 1

## 2019-08-14 MED ORDER — SUGAMMADEX SODIUM 200 MG/2ML IV SOLN
INTRAVENOUS | Status: DC | PRN
  Administered 2019-08-14: 200 mg via INTRAVENOUS

## 2019-08-14 MED ORDER — OXYCODONE HCL 5 MG/5ML PO SOLN: 5.0000 mg | Freq: Once | ORAL | Status: AC | PRN

## 2019-08-14 MED ORDER — ROCURONIUM BROMIDE 100 MG/10ML IV SOLN
INTRAVENOUS | Status: DC | PRN
  Administered 2019-08-14: 5 mg via INTRAVENOUS
  Administered 2019-08-14: 45 mg via INTRAVENOUS

## 2019-08-14 MED ORDER — OXYCODONE HCL 5 MG PO TABS: 5.0000 mg | ORAL_TABLET | ORAL | 0 refills | Status: DC | PRN

## 2019-08-14 MED ORDER — HYDROMORPHONE HCL 1 MG/ML IJ SOLN
INTRAMUSCULAR | Status: AC
  Filled 2019-08-14: qty 1

## 2019-08-14 MED ORDER — LIDOCAINE HCL (PF) 2 % IJ SOLN
INTRAMUSCULAR | Status: AC
  Filled 2019-08-14: qty 10

## 2019-08-14 MED ORDER — ONDANSETRON HCL 4 MG/2ML IJ SOLN: 4.0000 mg | Freq: Once | INTRAMUSCULAR | Status: AC

## 2019-08-14 MED ORDER — LIDOCAINE HCL (CARDIAC) PF 100 MG/5ML IV SOSY
PREFILLED_SYRINGE | INTRAVENOUS | Status: DC | PRN
  Administered 2019-08-14: 100 mg via INTRAVENOUS

## 2019-08-14 MED ORDER — NEOMYCIN-POLYMYXIN B GU 40-200000 IR SOLN
Status: AC
  Filled 2019-08-14: qty 20

## 2019-08-14 MED ORDER — ONDANSETRON HCL 4 MG PO TABS: 4.0000 mg | ORAL_TABLET | Freq: Three times a day (TID) | ORAL | 0 refills | Status: DC | PRN

## 2019-08-14 MED ORDER — NEOMYCIN-POLYMYXIN B GU 40-200000 IR SOLN
Status: DC | PRN
  Administered 2019-08-14: 4 mL

## 2019-08-14 MED ORDER — ONDANSETRON HCL 4 MG/2ML IJ SOLN
INTRAMUSCULAR | Status: DC | PRN
  Administered 2019-08-14: 4 mg via INTRAVENOUS

## 2019-08-14 MED ORDER — OXYCODONE HCL 5 MG PO TABS: 5.0000 mg | ORAL_TABLET | Freq: Once | ORAL | Status: AC | PRN

## 2019-08-14 MED ORDER — ONDANSETRON HCL 4 MG/2ML IJ SOLN
INTRAMUSCULAR | Status: AC
  Filled 2019-08-14: qty 2

## 2019-08-14 MED ORDER — FENTANYL CITRATE (PF) 100 MCG/2ML IJ SOLN
25.0000 ug | INTRAMUSCULAR | Status: DC | PRN
  Administered 2019-08-14: 18:00:00 50 ug via INTRAVENOUS

## 2019-08-14 MED ORDER — ASPIRIN EC 325 MG PO TBEC: 325.0000 mg | DELAYED_RELEASE_TABLET | Freq: Every day | ORAL | 0 refills | Status: DC

## 2019-08-14 MED ORDER — FENTANYL CITRATE (PF) 100 MCG/2ML IJ SOLN
INTRAMUSCULAR | Status: DC | PRN
  Administered 2019-08-14 (×2): 50 ug via INTRAVENOUS

## 2019-08-14 MED ORDER — LIDOCAINE HCL 1 % IJ SOLN
INTRAMUSCULAR | Status: DC | PRN
  Administered 2019-08-14: 5 mL

## 2019-08-14 MED ORDER — HYDROMORPHONE HCL 1 MG/ML IJ SOLN
INTRAMUSCULAR | Status: DC | PRN
  Administered 2019-08-14: .5 mg via INTRAVENOUS
  Administered 2019-08-14 (×2): .25 mg via INTRAVENOUS

## 2019-08-14 MED ORDER — FENTANYL CITRATE (PF) 100 MCG/2ML IJ SOLN
INTRAMUSCULAR | Status: AC
  Filled 2019-08-14: qty 2

## 2019-08-14 MED ORDER — PROPOFOL 10 MG/ML IV BOLUS
INTRAVENOUS | Status: AC
  Filled 2019-08-14: qty 20

## 2019-08-14 SURGICAL SUPPLY — 105 items
ADAPTER IRRIG TUBE 2 SPIKE SOL (ADAPTER) ×4 IMPLANT
ANCHOR PEEK 4.75X19.1 SWLK C (Anchor) ×2 IMPLANT
ANCHOR SUPER #2 ORTHOCORD (MISCELLANEOUS) IMPLANT
ARTHREX GRAFTPRO LOANER FEE (INSTRUMENTS) ×2
BASIN GRAD PLASTIC 32OZ STRL (MISCELLANEOUS) ×2 IMPLANT
BIT DRILL PIN RETRO (DRILL) ×1 IMPLANT
BLADE SURG 15 STRL LF DISP TIS (BLADE) ×1 IMPLANT
BLADE SURG 15 STRL SS (BLADE) ×1
BLADE SURG SZ11 CARB STEEL (BLADE) ×2 IMPLANT
BNDG COHESIVE 4X5 TAN STRL (GAUZE/BANDAGES/DRESSINGS) ×2 IMPLANT
BNDG COHESIVE 6X5 TAN STRL LF (GAUZE/BANDAGES/DRESSINGS) ×2 IMPLANT
BNDG ESMARK 6X12 TAN STRL LF (GAUZE/BANDAGES/DRESSINGS) IMPLANT
BUR RADIUS 3.5 (BURR) ×2 IMPLANT
BUR RADIUS 4.0X18.5 (BURR) ×2 IMPLANT
BUR RADIUS 5.5 (BURR) ×2 IMPLANT
CLEANER CAUTERY TIP 5X5 PAD (MISCELLANEOUS) ×1 IMPLANT
COOLER POLAR GLACIER W/PUMP (MISCELLANEOUS) ×2 IMPLANT
COVER BACK TABLE REUSABLE LG (DRAPES) ×2 IMPLANT
COVER WAND RF STERILE (DRAPES) ×2 IMPLANT
CUFF TOURN SGL QUICK 24 (TOURNIQUET CUFF)
CUFF TOURN SGL QUICK 30 (TOURNIQUET CUFF) ×1
CUFF TRNQT CYL 24X4X16.5-23 (TOURNIQUET CUFF) IMPLANT
CUFF TRNQT CYL 30X4X21-28X (TOURNIQUET CUFF) ×1 IMPLANT
CUTTER DUAL RETRO 10.5 (CUTTER) ×2 IMPLANT
DRAPE 3/4 80X56 (DRAPES) ×4 IMPLANT
DRAPE FLUOR MINI C-ARM 54X84 (DRAPES) ×2 IMPLANT
DRAPE IMP U-DRAPE 54X76 (DRAPES) ×4 IMPLANT
DRAPE INCISE IOBAN 66X45 STRL (DRAPES) IMPLANT
DRAPE POUCH INSTRU U-SHP 10X18 (DRAPES) ×2 IMPLANT
DRAPE U-SHAPE 47X51 STRL (DRAPES) ×2 IMPLANT
DRILL FLIPCUTTER II 10.5MM (CUTTER) ×1 IMPLANT
DRILL PIN RETRO (DRILL) ×2
DURAPREP 26ML APPLICATOR (WOUND CARE) ×6 IMPLANT
ELECT REM PT RETURN 9FT ADLT (ELECTROSURGICAL) ×2
ELECTRODE REM PT RTRN 9FT ADLT (ELECTROSURGICAL) ×1 IMPLANT
FASTFIX NDL DEL SYS 360 STRT (Miscellaneous) ×2 IMPLANT
FEE LOADER GRAFTPRO ARTHREX (INSTRUMENTS) ×1 IMPLANT
FLIPCUTTER II 10.5MM (CUTTER) ×2
GAUZE SPONGE 4X4 12PLY STRL (GAUZE/BANDAGES/DRESSINGS) ×2 IMPLANT
GAUZE XEROFORM 1X8 LF (GAUZE/BANDAGES/DRESSINGS) ×2 IMPLANT
GLOVE BIOGEL PI IND STRL 9 (GLOVE) ×1 IMPLANT
GLOVE BIOGEL PI INDICATOR 9 (GLOVE) ×1
GLOVE SURG 9.0 ORTHO LTXF (GLOVE) ×4 IMPLANT
GOWN STRL REUS TWL 2XL XL LVL4 (GOWN DISPOSABLE) ×2 IMPLANT
GOWN STRL REUS W/ TWL LRG LVL3 (GOWN DISPOSABLE) ×1 IMPLANT
GOWN STRL REUS W/TWL LRG LVL3 (GOWN DISPOSABLE) ×1
GRADUATE 1200CC STRL 31836 (MISCELLANEOUS) ×2 IMPLANT
GUIDEWIRE 1.2MMX18 (WIRE) ×2 IMPLANT
HANDLE YANKAUER SUCT BULB TIP (MISCELLANEOUS) ×2 IMPLANT
IMP SYS 2ND FIX PEEK 4.75X19.1 (Miscellaneous) ×2 IMPLANT
IMPL SYS 2ND FX PEEK 4.75X19.1 (Miscellaneous) ×1 IMPLANT
IMPL TIGHTROP FIBERTAG ACL (Orthopedic Implant) ×1 IMPLANT
IMPLANT TIGHTROPE FIBERTAG ACL (Orthopedic Implant) ×2 IMPLANT
IV LACTATED RINGER IRRG 3000ML (IV SOLUTION) ×12
IV LR IRRIG 3000ML ARTHROMATIC (IV SOLUTION) ×12 IMPLANT
KIT TRANSTIBIAL (DISPOSABLE) ×2 IMPLANT
KIT TURNOVER KIT A (KITS) ×2 IMPLANT
LABEL OR SOLS (LABEL) ×2 IMPLANT
MANIFOLD NEPTUNE II (INSTRUMENTS) ×2 IMPLANT
MAT ABSORB  FLUID 56X50 GRAY (MISCELLANEOUS) ×2
MAT ABSORB FLUID 56X50 GRAY (MISCELLANEOUS) ×2 IMPLANT
NDL SAFETY ECLIPSE 18X1.5 (NEEDLE) ×1 IMPLANT
NEEDLE FILTER BLUNT 18X 1/2SAF (NEEDLE) ×1
NEEDLE FILTER BLUNT 18X1 1/2 (NEEDLE) ×1 IMPLANT
NEEDLE HYPO 18GX1.5 SHARP (NEEDLE) ×1
NEEDLE HYPO 22GX1.5 SAFETY (NEEDLE) ×2 IMPLANT
PACK ARTHROSCOPY KNEE (MISCELLANEOUS) ×2 IMPLANT
PAD ABD DERMACEA PRESS 5X9 (GAUZE/BANDAGES/DRESSINGS) ×4 IMPLANT
PAD CLEANER CAUTERY TIP 5X5 (MISCELLANEOUS) ×1
PAD WRAPON POLAR KNEE (MISCELLANEOUS) ×1 IMPLANT
PENCIL ELECTRO HAND CTR (MISCELLANEOUS) ×2 IMPLANT
PUSHER KNOT ARTHRO STRT FASTFI (MISCELLANEOUS) ×2 IMPLANT
RETROCONST DRILL SET LOANER (INSTRUMENTS) ×2
SCREW INTERFERENCE CANN 11X28M (Screw) ×2 IMPLANT
SET DRILL RETROCONST  LOANER (INSTRUMENTS) ×1 IMPLANT
SET TUBE SUCT SHAVER OUTFL 24K (TUBING) ×2 IMPLANT
SET TUBE TIP INTRA-ARTICULAR (MISCELLANEOUS) ×2 IMPLANT
SPONGE LAP 18X18 RF (DISPOSABLE) ×2 IMPLANT
STRIP CLOSURE SKIN 1/2X4 (GAUZE/BANDAGES/DRESSINGS) ×4 IMPLANT
SUCTION FRAZIER HANDLE 10FR (MISCELLANEOUS) ×1
SUCTION TUBE FRAZIER 10FR DISP (MISCELLANEOUS) ×1 IMPLANT
SUT 2 FIBERLOOP 20 STRT BLUE (SUTURE) ×4
SUT ETHILON 4-0 (SUTURE) ×2
SUT ETHILON 4-0 FS2 18XMFL BLK (SUTURE) ×2
SUT FIBERWIRE #2 38 BLUE 1/2 (SUTURE) ×4
SUT FIBERWIRE #2 38 T-5 BLUE (SUTURE) ×4
SUT MNCRL AB 4-0 PS2 18 (SUTURE) ×4 IMPLANT
SUT ORTHOCORD 2X36 W/O NDL (SUTURE) IMPLANT
SUT VIC AB 0 CT1 36 (SUTURE) ×2 IMPLANT
SUT VIC AB 2-0 CT2 27 (SUTURE) ×2 IMPLANT
SUT VIC AB 2-0 SH 27 (SUTURE) ×1
SUT VIC AB 2-0 SH 27XBRD (SUTURE) ×1 IMPLANT
SUTURE 2 FIBERLOOP 20 STRT BLU (SUTURE) ×2 IMPLANT
SUTURE ETHLN 4-0 FS2 18XMF BLK (SUTURE) ×2 IMPLANT
SUTURE FIBERWR #2 38 BLUE 1/2 (SUTURE) ×2 IMPLANT
SUTURE FIBERWR #2 38 T-5 BLUE (SUTURE) ×2 IMPLANT
SYR 10ML LL (SYRINGE) ×4 IMPLANT
SYR BULB IRRIG 60ML STRL (SYRINGE) ×2 IMPLANT
SYSTEM NDL DEL FSTFX  360 STRT (Miscellaneous) ×1 IMPLANT
TAPE UMBIL 1/8X18 RADIOPA (MISCELLANEOUS) ×2 IMPLANT
TOWEL OR 17X26 4PK STRL BLUE (TOWEL DISPOSABLE) ×4 IMPLANT
TUBING ARTHRO INFLOW-ONLY STRL (TUBING) ×2 IMPLANT
TUBING CONNECTING 10 (TUBING) IMPLANT
WAND HAND CNTRL MULTIVAC 90 (MISCELLANEOUS) ×2 IMPLANT
WRAPON POLAR PAD KNEE (MISCELLANEOUS) ×2

## 2019-08-14 NOTE — H&P (Signed)
PREOPERATIVE H&P  Chief Complaint: S83.512A sprain of anterior cruciate ligament of left knee S83.412A Sprain of medial collateral ligament of left knee S83.282D other tear of lateral meniscus current injury left knee S83.242D other tear of medial meniscus current injury left knee  HPI: Damon Richard is a 38 y.o. male who presents for preoperative history and physical with a diagnosis of S83.512A sprain of anterior cruciate ligament of left knee S83.412A Sprain of medial collateral ligament of left knee S83.282D other tear of lateral meniscus current injury left knee S83.242D other tear of medial meniscus current injury left knee. Symptoms of left knee pain, swelling and instability are significantly impairing activities of daily living.  Patient has MRI confirmation of the torn ACL with low-grade sprains of the MCL and LCL.  Patient has possible medial lateral meniscal tears as well.  Given his young age and her activity level he has elected to proceed with surgical reconstruction of his anterior cruciate ligament.    Past Medical History:  Diagnosis Date  . GERD (gastroesophageal reflux disease)   . History of hiatal hernia    Past Surgical History:  Procedure Laterality Date  . WISDOM TOOTH EXTRACTION     Social History   Socioeconomic History  . Marital status: Married    Spouse name: Albin Felling  . Number of children: 0  . Years of education: 26  . Highest education level: Not on file  Occupational History  . Occupation: beer sales  Tobacco Use  . Smoking status: Former Games developer  . Smokeless tobacco: Former Neurosurgeon    Types: Chew  Substance and Sexual Activity  . Alcohol use: Yes    Alcohol/week: 1.0 - 40.0 standard drinks    Types: 1 - 40 Cans of beer per week    Comment: occ  . Drug use: No  . Sexual activity: Yes    Partners: Female    Birth control/protection: Other-see comments    Comment: partner takes birth control pills  Other Topics Concern  . Not on file  Social  History Narrative   Lives with girlfriend.   Social Determinants of Health   Financial Resource Strain:   . Difficulty of Paying Living Expenses: Not on file  Food Insecurity:   . Worried About Programme researcher, broadcasting/film/video in the Last Year: Not on file  . Ran Out of Food in the Last Year: Not on file  Transportation Needs:   . Lack of Transportation (Medical): Not on file  . Lack of Transportation (Non-Medical): Not on file  Physical Activity:   . Days of Exercise per Week: Not on file  . Minutes of Exercise per Session: Not on file  Stress:   . Feeling of Stress : Not on file  Social Connections:   . Frequency of Communication with Friends and Family: Not on file  . Frequency of Social Gatherings with Friends and Family: Not on file  . Attends Religious Services: Not on file  . Active Member of Clubs or Organizations: Not on file  . Attends Banker Meetings: Not on file  . Marital Status: Not on file   Family History  Problem Relation Age of Onset  . Cancer Father 41       bladder; non-smoker   Allergies  Allergen Reactions  . Cephalexin Rash    Had been on Keflex and Bactrim at the same time, developed a rash Had been on Keflex and Bactrim at the same time, developed a rash   . Sulfamethoxazole-Trimethoprim  Rash    Patient on Keflex and Bactrim at the same time, developed rash Patient on Keflex and Bactrim at the same time, developed rash    Prior to Admission medications   Medication Sig Start Date End Date Taking? Authorizing Provider  Cyanocobalamin (VITAMIN B 12 PO) Take 1 tablet by mouth as needed. TAKES SPORADICALLY   Yes [provider]  ibuprofen (ADVIL) 600 MG tablet Take 1 tablet (600 mg total) by mouth every 6 (six) hours as needed. 06/15/19  Yes Laban Emperor, PA-C  omeprazole (PRILOSEC) 20 MG capsule Take 20 mg by mouth as needed.    Yes [provider]  VITAMIN D PO Take 1 tablet by mouth as needed. TAKES SPORADICALLY   Yes  [provider]  cephALEXin (KEFLEX) 500 MG capsule Take 1 capsule (500 mg total) by mouth 4 (four) times daily. Patient not taking: Reported on 07/28/2019 03/10/15   Roselee Culver, MD  chlorpheniramine-HYDROcodone [facility] PENNKINETIC ER) 10-8 MG/5ML [facility] Take 5 mLs by mouth every 12 (twelve) hours as needed (cough). Patient not taking: Reported on 03/10/2015 08/12/12   Harrison Mons, PA  cyclobenzaprine (FLEXERIL) 10 MG tablet Take 1 tablet (10 mg total) by mouth 3 (three) times daily as needed for muscle spasms. Patient not taking: Reported on 07/28/2019 03/10/15   Roselee Culver, MD  Guaifenesin [facility] MAXIMUM STRENGTH) 1200 MG TB12 Take 1 tablet (1,200 mg total) by mouth every 12 (twelve) hours as needed. Patient not taking: Reported on 03/10/2015 08/12/12   Harrison Mons, PA  ipratropium (ATROVENT) 0.03 % nasal spray Place 2 sprays into the nose 2 (two) times daily. Patient not taking: Reported on 03/10/2015 08/12/12   Harrison Mons, PA  sulfamethoxazole-trimethoprim (BACTRIM DS,SEPTRA DS) 800-160 MG per tablet Take 12 tablets by mouth 2 (two) times daily. Patient not taking: Reported on 07/28/2019 03/10/15   Roselee Culver, MD     Positive ROS: All other systems have been reviewed and were otherwise negative with the exception of those mentioned in the HPI and as above.  Physical Exam: General: Alert, no acute distress Cardiovascular: Regular rate and rhythm, no murmurs rubs or gallops.  No pedal edema Respiratory: Clear to auscultation bilaterally, no wheezes rales or rhonchi. No cyanosis, no use of accessory musculature GI: No organomegaly, abdomen is soft and non-tender nondistended with positive bowel sounds. Skin: Skin intact, no lesions within the operative field. Neurologic: Sensation intact distally Psychiatric: Patient is competent for consent with normal mood and affect Lymphatic: No cervical lymphadenopathy  MUSCULOSKELETAL: Left knee:  Patient skin is intact.  He has a trace effusion without erythema or ecchymosis.  Patient has palpable pedal pulses, intact sensation intact and intact motor function.  Patient has increased laxity with Lachman's and anterior drawer testing.  He has no instability to varus or valgus stress testing.  Patient's range of motion is from near full extension to approximately 110 to 120 degrees of flexion.  Assessment: S83.512A sprain of anterior cruciate ligament of left knee S83.412A Sprain of medial collateral ligament of left knee S83.282D other tear of lateral meniscus current injury left knee S83.242D other tear of medial meniscus current injury left knee  Plan: Plan for Procedure(s): LEFT KNEE ARTHROSCOPIC ASSISTED RECONSTRUCTION OF THE ANTERIOR CRUCIATE LIGAMENT AND POSSIBLE MENISCUS REPAIR VS PARTIAL MENISCECTOMY  I reviewed with the patient the details of the operation as well as the postoperative course.  I discussed the risks and benefits of surgery. The risks include but are not limited to  infection, bleeding, nerve or blood vessel injury, joint stiffness or loss of motion, persistent pain, weakness or instability, retear of the ACL and hardware failure and the need for further surgery. Medical risks include but are not limited to DVT and pulmonary embolism, myocardial infarction, stroke, pneumonia, respiratory failure and death. Patient understood these risks and wished to proceed.     Juanell FairlyKevin Delma Villalva, MD   08/14/2019 1:13 PM

## 2019-08-14 NOTE — Anesthesia Preprocedure Evaluation (Signed)
Anesthesia Evaluation  Patient identified by MRN, date of birth, ID band Patient awake    Reviewed: Allergy & Precautions, H&P , NPO status , Patient's Chart, lab work & pertinent test results  History of Anesthesia Complications Negative for: history of anesthetic complications  Airway Mallampati: II  TM Distance: >3 FB Neck ROM: full    Dental  (+) Chipped, Poor Dentition   Pulmonary neg pulmonary ROS, neg shortness of breath, former smoker,           Cardiovascular Exercise Tolerance: Good (-) angina(-) Past MI and (-) DOE negative cardio ROS       Neuro/Psych negative neurological ROS  negative psych ROS   GI/Hepatic Neg liver ROS, hiatal hernia, GERD  Medicated and Controlled,  Endo/Other  negative endocrine ROS  Renal/GU      Musculoskeletal   Abdominal   Peds  Hematology negative hematology ROS (+)   Anesthesia Other Findings Past Medical History: No date: GERD (gastroesophageal reflux disease) No date: History of hiatal hernia  Past Surgical History: No date: WISDOM TOOTH EXTRACTION  BMI    Body Mass Index: 27.39 kg/m      Reproductive/Obstetrics negative OB ROS                             Anesthesia Physical Anesthesia Plan  ASA: II  Anesthesia Plan: General ETT   Post-op Pain Management:    Induction: Intravenous  PONV Risk Score and Plan: Ondansetron, Dexamethasone, Midazolam and Treatment may vary due to age or medical condition  Airway Management Planned: Oral ETT  Additional Equipment:   Intra-op Plan:   Post-operative Plan: Extubation in OR  Informed Consent: I have reviewed the patients History and Physical, chart, labs and discussed the procedure including the risks, benefits and alternatives for the proposed anesthesia with the patient or authorized representative who has indicated his/her understanding and acceptance.     Dental Advisory  Given  Plan Discussed with: Anesthesiologist, CRNA and Surgeon  Anesthesia Plan Comments: (Patient consented for risks of anesthesia including but not limited to:  - adverse reactions to medications - damage to teeth, lips or other oral mucosa - sore throat or hoarseness - Damage to heart, brain, lungs or loss of life  Patient voiced understanding.)        Anesthesia Quick Evaluation

## 2019-08-14 NOTE — Anesthesia Postprocedure Evaluation (Signed)
Anesthesia Post Note  Patient: Joshau Stahlman  Procedure(s) Performed: RECONSTRUCTION ANTERIOR CRUCIATE LIGAMENT (ACL) (Left Knee)  Patient location during evaluation: PACU Anesthesia Type: General Level of consciousness: awake and alert Pain management: pain level controlled Vital Signs Assessment: post-procedure vital signs reviewed and stable Respiratory status: spontaneous breathing, nonlabored ventilation, respiratory function stable and patient connected to nasal cannula oxygen Cardiovascular status: blood pressure returned to baseline and stable Postop Assessment: no apparent nausea or vomiting Anesthetic complications: no     Last Vitals:  Vitals:   08/14/19 1806 08/14/19 1821  BP: 116/76 122/80  Pulse: 86 84  Resp: 15 19  Temp:  (!) 36.2 C  SpO2: 96% 96%    Last Pain:  Vitals:   08/14/19 1821  TempSrc:   PainSc: 4                  Martha Clan

## 2019-08-14 NOTE — Anesthesia Procedure Notes (Signed)
Procedure Name: Intubation Date/Time: 08/14/2019 1:29 PM Performed by: Johnna Acosta, CRNA Pre-anesthesia Checklist: Patient identified, Emergency Drugs available, Suction available, Patient being monitored and Timeout performed Patient Re-evaluated:Patient Re-evaluated prior to induction Oxygen Delivery Method: Circle system utilized Preoxygenation: Pre-oxygenation with 100% oxygen Induction Type: IV induction and Rapid sequence Laryngoscope Size: McGraph and 4 Grade View: Grade I Tube type: Oral Tube size: 8.0 mm Number of attempts: 1 Airway Equipment and Method: Stylet and Video-laryngoscopy Placement Confirmation: ETT inserted through vocal cords under direct vision,  positive ETCO2 and breath sounds checked- equal and bilateral Secured at: 21 cm Tube secured with: Tape Dental Injury: Teeth and Oropharynx as per pre-operative assessment  Difficulty Due To: Difficulty was anticipated

## 2019-08-14 NOTE — Op Note (Addendum)
08/14/2019  5:10 PM  PATIENT:  Damon Richard    PRE-OPERATIVE DIAGNOSIS:   S83.512A sprain of anterior cruciate ligament of left knee S83.242D other tear of medial meniscus current injury left knee  POST-OPERATIVE DIAGNOSIS:   1.  Left knee ACL tear from the femoral origin 2.  Vertical, undersurface tear of the posterior horn of the medial meniscus  3.  Radial tear of body of the lateral meniscus  PROCEDURE:  LEFT KNEE RECONSTRUCTION ANTERIOR CRUCIATE LIGAMENT WITH HAMSTRING AUTOGRAFT, MEDIAL MENISCUS REPAIR AND PARTIAL LATERAL MENISCECTOMY  SURGEON:  Juanell Fairly, MD  ANESTHESIA:   General  PREOPERATIVE INDICATIONS:  Damon Richard is a  38 y.o. male with a diagnosis of left knee ACL and medial meniscus tear..    The risks benefits and alternatives were discussed with the patient preoperatively including but not limited to the risks of infection, bleeding, nerve or blood vessel injury, knee stiffness/arthrofibrosis, hardware failure, re-tear of the anterior cruciate ligament graft, persistent pain or instability, osteoarthritis and the need for revision surgery.  Medical risks include but are not limited to DVT and pulmonary embolism, stroke, pneumonia, respiratory failure and death. Patient understood these risks and wished to proceed with surgical reconstruction.   OPERATIVE IMPLANTS: Arthrex anterior cruciate ligament tightrope RC for  femoral fixation, Artherex biocomposite 11 x 28 mm tibial interference screw and two Arthrex swivel lock anchors for back up tibial fixationi.  Smith & Nephew Fast Fix 360 meniscal anchor x 1 for medial meniscus repair.  OPERATIVE PROCEDURE: Patient was in the preoperative area.  A preop history and physical was performed at the bedside.  The left knee was marked with the word yes according the hospital's correct site of surgery protocol. The patient was brought to the operating room and placed in the supine position. General anesthesia was  administered.  He was given for antibiotic prophylaxis. The left lower extremity was prepped and draped in usual sterile fashion.   A time out was performed to verify the patient's name, date of birth, medical record number, correct site of surgery correct procedure to be performed. It was also used to verify the patient received antibiotics and all appropriate instruments, implants and radiographs studies were available in the room. Once all in attendance were in agreement case began. A tourniquet was applied to the left upper thigh but was not inflated.  Exam under anesthesia was performed which demonstrated range of motion from 0 to approximately 120 degrees flexion.  Patient had anterior laxity on anterior drawer testing and Lachman's tests of approximately 5 mm.  Kidney equivocal pivot shift.   Patient had no instability to varus valgus stress testing at 0 and 30 of flexion.  He did not have a significant effusion.   Proposed arthroscopy incisions were drawn out with a surgical marker and pre-injected with 1% lidocaine plain. An 11 blade was used to establish an inferior medial and lateral portals. The medial portal was created under direct visualization using an 18-gauge spinal needle for localization. A full diagnostic examination of the knee was performed including the suprapatellar pouch, the patella femoral joint, medial lateral gutters, the medial and lateral compartments, the intercondylar notch in the posterior knee.   Patient had significant synovitis in the anterior knee and a hypertrophic Hoffa's fat pad with adhesions requiring extensive debridement of the anterior knee for adequate visualization.  This was performed with a 90 degree ArthroCare wand and a 4.0 mm resector shaver blade.    The medial meniscus was found  to have a vertical tear involving the posterior horn.  This is an undersurface tear.  A single Smith [employer] Nephew Fast Fix 360 anchor was used to repair this meniscal tear.   Probing of the meniscus after placement of the FasT-Fix anchor revealed stability to the meniscus.  The knee was then placed in a figure of 4 position.  The lateral meniscus was found to have a radial tear involving the body of the meniscus.  A straight biting punch and 4.0 resector shaver blade was used to perform a partial lateral meniscectomy.  No focal chondral lesions of the lateral compartment were seen.  The torn anterior cruciate ligament fibers were adequately visualized, they were debrided with a 5.43mm resector shaver blade and 90 degree ArthroCare wand.  A 5.18mm resector shaver blade was also used perform a notchplasty. Once the intercondylar notch was prepped the attention was turned to harvesting the hamstring autografts.  A longitudinal incision was made over the anteromedial proximal tibia. The sartorius fascia was incised with a 15 blade and reflected to reveal the underlying gracilis and semitendinosus. These were harvested using a tendon stripper. The grafts were prepared on the back table. The graft was measured to be 10.5 mm on the femoral side and 10.5 mm on the tibial side. The length of the grafts was 300 mm/122mm folded in half. The graft was placed on the Graftmaster table under 15 mmHg of tension and kept moist on the back table until implantation.   The attention was then turned to tunnel creation. The femoral tunnel cutting guide was then placed through the lateral portal. The arthroscope was placed in the medial portal at this point. The intercondylar distance was measured at 40 mm at. A flip cutter drill guide was advanced into the intercondylar notch. The blade was engaged and the femoral tunnel was created in a retrograde fashion to 35 mm. A fiber stick suture was placed through the femoral tunnel brought out the lateral portal and clamped for later graft passage.   The attention was then turned to tibial tunnel creation. This was done with a fixed angle tibial retro-drill  guide. A drill pin was inserted through the anterior tibia and advanced until it engaged the 10.5 mm drill bit. A tibial tunnel was then created in a retrograde fashion. The fiber stick was brought out through the tibial tunnel. The 4 stranded hamstring tibial autograft was then shuttled through the knee using the fiber stick graft. Once the button was flipped on the lateral femoral cortex FluoroScan image was taken to confirm it was laying flat against the lateral cortex of the femur. Once this was confirmed the hamstring graft was advanced into position using the white suture ends of the Arthrex tight rope RC button. The graft was bottomed out into the femoral tunnel. The knee was then cycled 25 times to remove creep. The knee was then flexed approximately 30. An Arthrex bio composite interference screw 11 x 28 mm was then advanced into position with countertraction on the tibial side of the graft and a posterior drawer force directed to the tibia. Once the interference screw was in position, the tibial graft sutures were secured using backup fixation with 2 Arthrex swivel lock anchors into the anterior tibia..  The patient had a firm endpoint without anterior laxity on Lachman's test. The range of motion was 0-120. Final arthroscopic images of the graft were taken. There was no graft impingement in full extension. The wounds were copiously irrigated. The deep  fascia of the anterior tibial incision was closed with interrupted 0 Vicryl.  The of the tibial incision subcutaneous tissue was closed with a 2-0 Vicryl and the skin was approximated with a running 4-0 Monocryl. The arthroscopy portal incisions were closed with 4-0 nylon along with the small stab incision over the lateral femur used for placement of the femoral tunnel.  Patient had a dry sterile dressing applied along with Steri-Strips and Xeroform. The incisions and the joint were injected with 0.25% Marcaine plain.  Patient had a Polar Care sleeve  along with a hinged knee brace locked in extension. The patient was brought to the PACU in stable condition. I was scrubbed and present the entire case and all sharp and instrument counts were correct at conclusion the case. ..  The patient was awakened and brought to PACU in stable condition. I spoke with the patient's wife in the postop consultation room to let her know that patient was stable in recovery room the case had been performed without complication.

## 2019-08-14 NOTE — Discharge Instructions (Signed)
AMBULATORY SURGERY  °DISCHARGE INSTRUCTIONS ° ° °1) The drugs that you were given will stay in your system until tomorrow so for the next 24 hours you should not: ° °A) Drive an automobile °B) Make any legal decisions °C) Drink any alcoholic beverage ° ° °2) You may resume regular meals tomorrow.  Today it is better to start with liquids and gradually work up to solid foods. ° °You may eat anything you prefer, but it is better to start with liquids, then soup and crackers, and gradually work up to solid foods. ° ° °3) Please notify your doctor immediately if you have any unusual bleeding, trouble breathing, redness and pain at the surgery site, drainage, fever, or pain not relieved by medication. ° °Please contact your physician with any problems or Same Day Surgery at [phone], Monday through Friday 6 am to 4 pm, or [place] [facility] [place] [facility] number at [phone]. °

## 2019-08-14 NOTE — Transfer of Care (Signed)
Immediate Anesthesia Transfer of Care Note  Patient: Damon Richard  Procedure(s) Performed: RECONSTRUCTION ANTERIOR CRUCIATE LIGAMENT (ACL) (Left Knee)  Patient Location: PACU  Anesthesia Type:General  Level of Consciousness: sedated  Airway & Oxygen Therapy: Patient Spontanous Breathing and Patient connected to face mask oxygen  Post-op Assessment: Report given to RN and Post -op Vital signs reviewed and stable  Post vital signs: Reviewed  Last Vitals:  Vitals Value Taken Time  BP 92/54 08/14/19 1706  Temp 36.1 C 08/14/19 1706  Pulse 71 08/14/19 1707  Resp 8 08/14/19 1707  SpO2 97 % 08/14/19 1707  Vitals shown include unvalidated device data.  Last Pain:  Vitals:   08/14/19 1139  TempSrc: Tympanic  PainSc: 0-No pain         Complications: No apparent anesthesia complications

## 2019-08-14 NOTE — Anesthesia Post-op Follow-up Note (Signed)
Anesthesia QCDR form completed.        

## 2020-04-19 IMAGING — DX DG KNEE COMPLETE 4+V*L*
4 series · 4 of 4 positions shown · non-contrast
Comparison: None.

CLINICAL DATA: Initial evaluation for acute left knee pain status
post injury.

EXAM:
LEFT KNEE - COMPLETE 4+ VIEW

[knee ap]
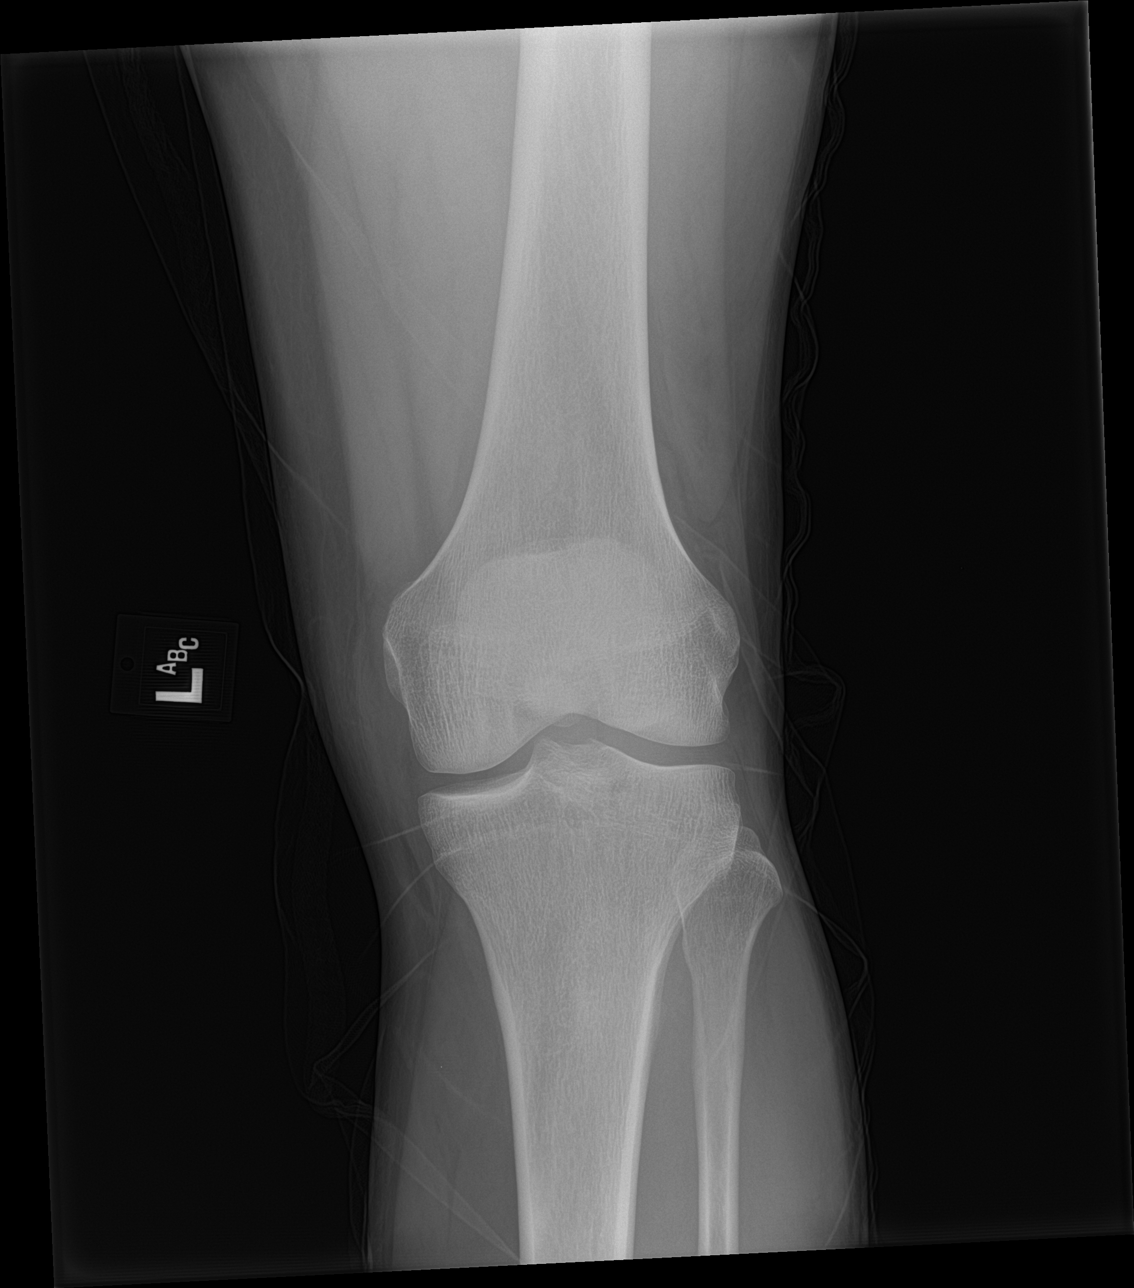

[knee lat]
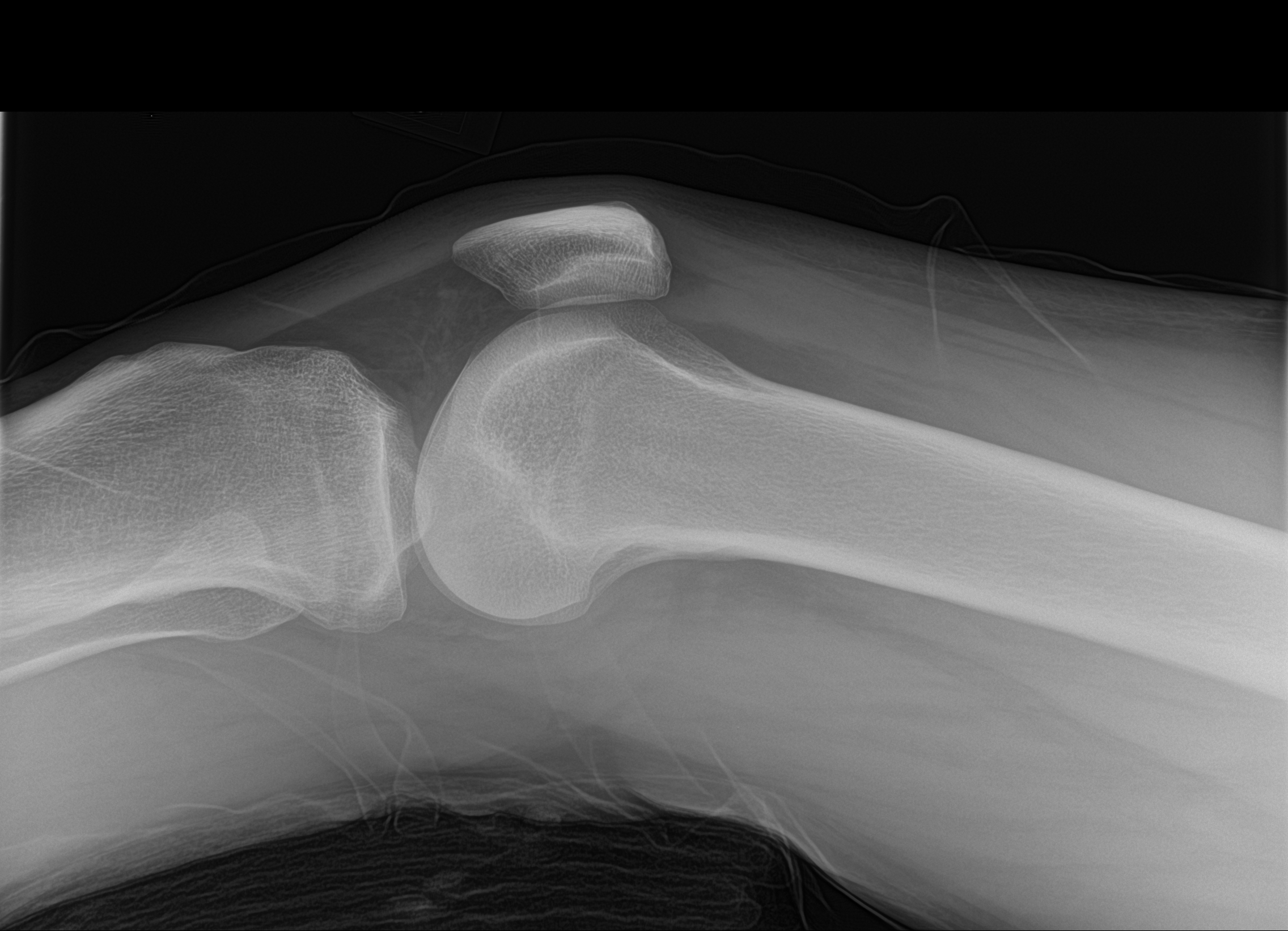

[knee obl (1 of 2)]
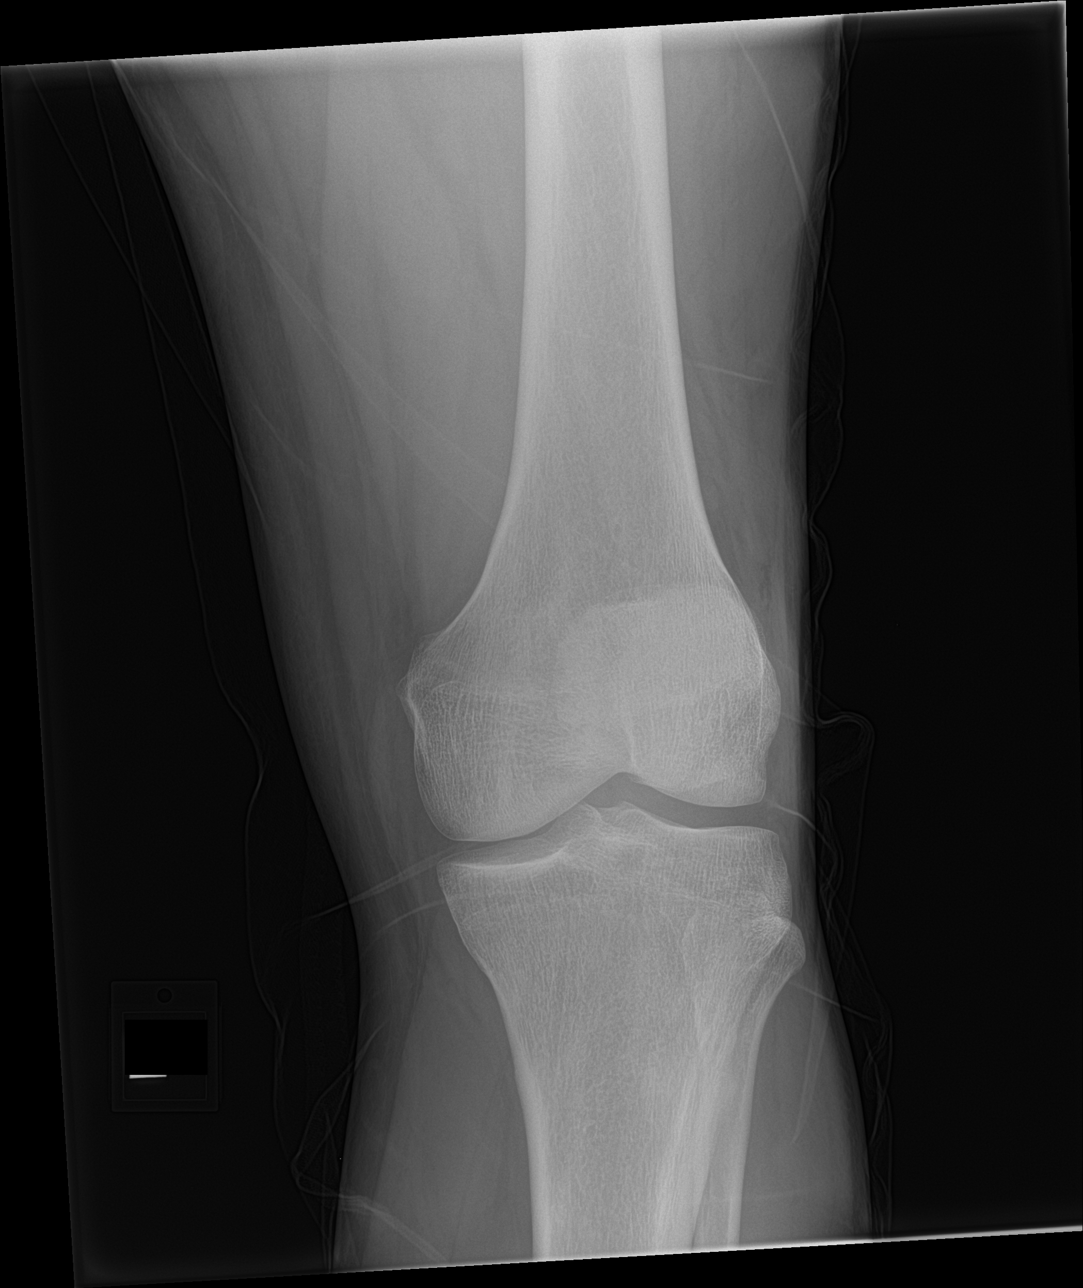

[knee obl (2 of 2)]
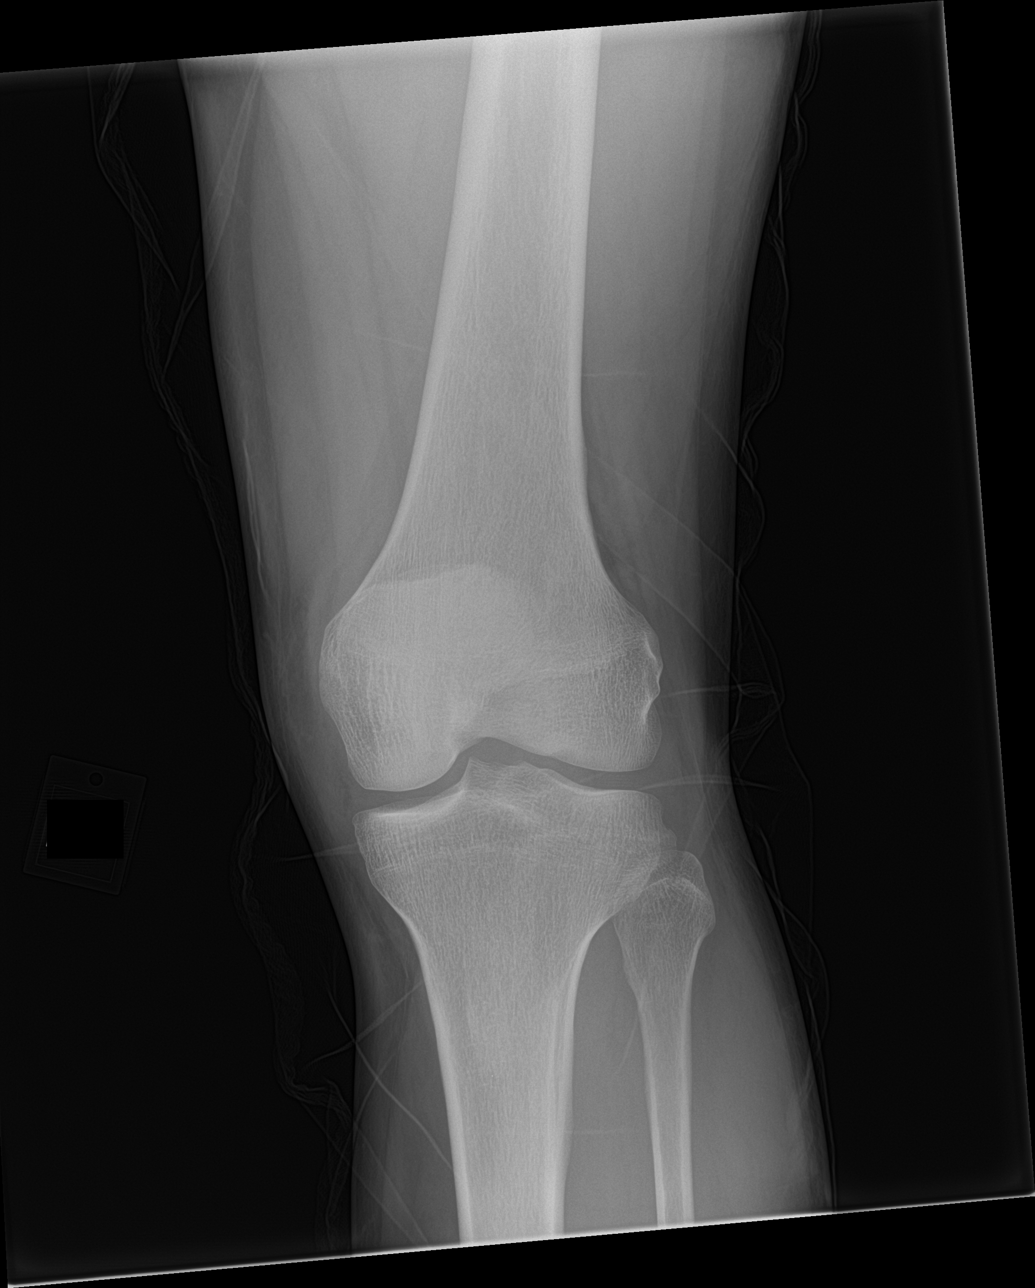

[4 of 4 positions shown; findings below may reference images not displayed]

FINDINGS: No acute fracture or dislocation. Moderate effusion seen within the
suprapatellar recess. Osseous mineralization normal. No discrete
osseous lesions. Visualized soft tissues within normal limits.
IMPRESSION: 1. Moderate joint effusion within the suprapatellar recess.
2. No acute osseous abnormality.

## 2021-09-19 ENCOUNTER — Ambulatory Visit
Admission: EM | Admit: 2021-09-19 | Discharge: 2021-09-19 | Disposition: A | Discharge status: Home / Self Care | Payer: Commercial Managed Care - PPO | Attending: Internal Medicine | Admitting: Internal Medicine

## 2021-09-19 ENCOUNTER — Other Ambulatory Visit: Payer: Self-pay

## 2021-09-19 DIAGNOSIS — J069 Acute upper respiratory infection, unspecified: Secondary | ICD-10-CM | POA: Diagnosis present

## 2021-09-19 DIAGNOSIS — J029 Acute pharyngitis, unspecified: Secondary | ICD-10-CM | POA: Diagnosis present

## 2021-09-19 LAB — POCT RAPID STREP A (OFFICE): Rapid Strep A Screen: NEGATIVE

## 2021-09-19 LAB — POCT INFLUENZA A/B
Influenza A, POC: NEGATIVE
Influenza B, POC: NEGATIVE

## 2021-09-19 NOTE — ED Provider Notes (Signed)
[facility]    CSN: [id number] Arrival date & time: 09/19/21  1338      History   Chief Complaint Chief Complaint  Patient presents with   Sore Throat   Nasal Congestion    HPI Damon Richard is a 41 y.o. male.   Patient presents with sore throat, nasal congestion, bilateral ear pain that has been present for approximately 2 to 3 days.  Denies any known fevers at home but states that he "felt feverish".  His daughter has similar symptoms currently.  Denies chest pain, shortness of breath, nausea, vomiting, diarrhea, abdominal pain.  Patient is taking ibuprofen for symptoms.   Sore Throat   Past Medical History:  Diagnosis Date   GERD (gastroesophageal reflux disease)    History of hiatal hernia     Patient Active Problem List   Diagnosis Date Noted   GERD 07/27/2009   RECTAL BLEEDING 07/27/2009   ABDOMINAL PAIN -GENERALIZED 07/27/2009   NONSPEC ABN FINDNG RAD & OTH EXAM ABDOMINAL AREA 07/27/2009    Past Surgical History:  Procedure Laterality Date   ANTERIOR CRUCIATE LIGAMENT REPAIR Left 08/14/2019   Procedure: RECONSTRUCTION ANTERIOR CRUCIATE LIGAMENT (ACL);  Surgeon: Juanell Fairly, MD;  Location: [facility] ORS;  Service: Orthopedics;  Laterality: Left;   WISDOM TOOTH EXTRACTION         Home Medications    Prior to Admission medications   Medication Sig Start Date End Date Taking? Authorizing Provider  Cyanocobalamin (VITAMIN B 12 PO) Take 1 tablet by mouth as needed. TAKES SPORADICALLY   Yes [provider]  aspirin EC 325 MG tablet Take 1 tablet (325 mg total) by mouth daily. 08/14/19   Juanell Fairly, MD  omeprazole (PRILOSEC) 20 MG capsule Take 20 mg by mouth as needed.     [provider]  ondansetron (ZOFRAN) 4 MG tablet Take 1 tablet (4 mg total) by mouth every 8 (eight) hours as needed for nausea or vomiting. 08/14/19   Juanell Fairly, MD  oxyCODONE (OXY IR/ROXICODONE) 5 MG immediate release tablet Take 1 tablet (5  mg total) by mouth every 4 (four) hours as needed. 08/14/19   Juanell Fairly, MD  VITAMIN D PO Take 1 tablet by mouth as needed. TAKES SPORADICALLY    [provider]    Family History Family History  Problem Relation Age of Onset   Cancer Father 53       bladder; non-smoker    Social History Social History   Tobacco Use   Smoking status: Former   Smokeless tobacco: Former    Types: Associate Professor Use: Never used  Substance Use Topics   Alcohol use: Yes    Alcohol/week: 1.0 - 40.0 standard drink    Types: 1 - 40 Cans of beer per week    Comment: occ   Drug use: No     Allergies   Cephalexin and Sulfamethoxazole-trimethoprim   Review of Systems Review of Systems Per HPI  Physical Exam Triage Vital Signs ED Triage Vitals [09/19/21 1522]  Enc Vitals Group     BP 128/68     Pulse Rate 85     Resp 20     Temp 100.2 F (37.9 C)     Temp Source Oral     SpO2 95 %     Weight      Height      Head Circumference      Peak Flow      Pain  Score 5     Pain Loc      Pain Edu?      Excl. in GC?    No data found.  Updated Vital Signs BP 128/68 (BP Location: Left Arm)    Pulse 85    Temp 100.2 F (37.9 C) (Oral)    Resp 20    SpO2 95%   Visual Acuity Right Eye Distance:   Left Eye Distance:   Bilateral Distance:    Right Eye Near:   Left Eye Near:    Bilateral Near:     Physical Exam Constitutional:      General: He is not in acute distress.    Appearance: Normal appearance. He is not toxic-appearing or diaphoretic.  HENT:     Head: Normocephalic and atraumatic.     Right Ear: Tympanic membrane and ear canal normal.     Left Ear: Tympanic membrane and ear canal normal.     Nose: Congestion present.     Mouth/Throat:     Mouth: Mucous membranes are moist.     Pharynx: Posterior oropharyngeal erythema present.  Eyes:     Extraocular Movements: Extraocular movements intact.     Conjunctiva/sclera: Conjunctivae normal.      Pupils: Pupils are equal, round, and reactive to light.  Cardiovascular:     Rate and Rhythm: Normal rate and regular rhythm.     Pulses: Normal pulses.     Heart sounds: Normal heart sounds.  Pulmonary:     Effort: Pulmonary effort is normal. No respiratory distress.     Breath sounds: Normal breath sounds. No stridor. No wheezing, rhonchi or rales.  Abdominal:     General: Abdomen is flat. Bowel sounds are normal.     Palpations: Abdomen is soft.  Musculoskeletal:        General: Normal range of motion.     Cervical back: Normal range of motion.  Skin:    General: Skin is warm and dry.  Neurological:     General: No focal deficit present.     Mental Status: He is alert and oriented to person, place, and time. Mental status is at baseline.  Psychiatric:        Mood and Affect: Mood normal.        Behavior: Behavior normal.     UC Treatments / Results  Labs (all labs ordered are listed, but only abnormal results are displayed) Labs Reviewed  CULTURE, GROUP A STREP ([facility])  NOVEL CORONAVIRUS, NAA  POCT RAPID STREP A (OFFICE)  POCT INFLUENZA A/B    EKG   Radiology No results found.  Procedures Procedures (including critical care time)  Medications Ordered in UC Medications - No data to display  Initial Impression / Assessment and Plan / UC Course  I have reviewed the triage vital signs and the nursing notes.  Pertinent labs & imaging results that were available during my care of the patient were reviewed by me and considered in my medical decision making (see chart for details).     Patient presents with symptoms likely from a viral upper respiratory infection. Differential includes bacterial pneumonia, sinusitis, allergic rhinitis, COVID-19, flu. Do not suspect underlying cardiopulmonary process. Symptoms seem unlikely related to ACS, CHF or COPD exacerbations, pneumonia, pneumothorax. Patient is nontoxic appearing and not in need of emergent medical intervention.   Rapid flu and rapid strep were negative.  Throat culture and COVID-19 viral swab pending.  Recommended symptom control with over the counter medications: Daily oral  anti-histamine, Oral decongestant or IN corticosteroid, saline irrigations, cepacol lozenges, Robitussin, Delsym, honey tea.  Return if symptoms fail to improve in 1-2 weeks or you develop shortness of breath, chest pain, severe headache. Patient states understanding and is agreeable.  Discharged with PCP followup.  Final Clinical Impressions(s) / UC Diagnoses   Final diagnoses:  Viral upper respiratory infection  Sore throat     Discharge Instructions      It appears that you have a viral upper respiratory infection.  Rapid strep and rapid flu were negative.  Throat culture and COVID-19 viral swab are pending.  We will call if they are positive.  Recommend symptomatic treatment with over-the-counter therapies.     ED Prescriptions   None    PDMP not reviewed this encounter.   Gustavus Bryant, [place] 09/19/21 [phone]

## 2021-09-19 NOTE — ED Triage Notes (Signed)
Pt presents with sore throat, nasal congestion, ear pressure, feeling drained x 3 days.  Daughter is sick as well but no dx yet.

## 2021-09-19 NOTE — Discharge Instructions (Signed)
It appears that you have a viral upper respiratory infection.  Rapid strep and rapid flu were negative.  Throat culture and COVID-19 viral swab are pending.  We will call if they are positive.  Recommend symptomatic treatment with over-the-counter therapies.

## 2021-09-20 LAB — SARS-COV-2, NAA 2 DAY TAT

## 2021-09-20 LAB — NOVEL CORONAVIRUS, NAA: SARS-CoV-2, NAA: NOT DETECTED

## 2021-09-22 LAB — CULTURE, GROUP A STREP (THRC)

## 2022-11-27 ENCOUNTER — Ambulatory Visit
Admission: EM | Admit: 2022-11-27 | Discharge: 2022-11-27 | Disposition: A | Discharge status: Home / Self Care | Payer: Commercial Managed Care - PPO | Attending: Family Medicine | Admitting: Family Medicine

## 2022-11-27 DIAGNOSIS — S0502XA Injury of conjunctiva and corneal abrasion without foreign body, left eye, initial encounter: Secondary | ICD-10-CM

## 2022-11-27 MED ORDER — GENTAMICIN SULFATE 0.3 % OP SOLN: 2.0000 [drp] | Freq: Three times a day (TID) | OPHTHALMIC | 0 refills | Status: AC

## 2022-11-27 NOTE — Discharge Instructions (Signed)
Put gentamicin eyedrops in the affected eye(s) 3 times daily for 5 days.  

## 2022-11-27 NOTE — ED Triage Notes (Signed)
Pt states yesterday he was building a rabbit cage and concerned that a piece of metal has gone into the left eye.

## 2022-11-27 NOTE — ED Provider Notes (Signed)
[facility]    CSN: [id number] Arrival date & time: 11/27/22  0827      History   Chief Complaint Chief Complaint  Patient presents with   might be metal in eye    HPI Damon Richard is a 42 y.o. male.   HPI Here for irritation and possible foreign body in his left eye.  Yesterday he was building a rabbit cage and using a staple gun.  A staple flew up and possibly struck him in the left eye.  It is felt irritated and he has had a foreign body sensation since then.  Maybe a little tearing  He is allergic to cephalexin and sulfa, both give him a rash    Past Medical History:  Diagnosis Date   GERD (gastroesophageal reflux disease)    History of hiatal hernia     Patient Active Problem List   Diagnosis Date Noted   GERD 07/27/2009   RECTAL BLEEDING 07/27/2009   ABDOMINAL PAIN -GENERALIZED 07/27/2009   NONSPEC ABN FINDNG RAD & OTH EXAM ABDOMINAL AREA 07/27/2009    Past Surgical History:  Procedure Laterality Date   ANTERIOR CRUCIATE LIGAMENT REPAIR Left 08/14/2019   Procedure: RECONSTRUCTION ANTERIOR CRUCIATE LIGAMENT (ACL);  Surgeon: Juanell Fairly, MD;  Location: [facility] ORS;  Service: Orthopedics;  Laterality: Left;   WISDOM TOOTH EXTRACTION         Home Medications    Prior to Admission medications   Medication Sig Start Date End Date Taking? Authorizing Provider  gentamicin (GARAMYCIN) 0.3 % ophthalmic solution Place 2 drops into the left eye 3 (three) times daily for 5 days. 11/27/22 12/02/22 Yes Darvell Monteforte, Janace Aris, MD  Cyanocobalamin (VITAMIN B 12 PO) Take 1 tablet by mouth as needed. TAKES SPORADICALLY    [provider]  omeprazole (PRILOSEC) 20 MG capsule Take 20 mg by mouth as needed.     [provider]  VITAMIN D PO Take 1 tablet by mouth as needed. TAKES SPORADICALLY    [provider]    Family History Family History  Problem Relation Age of Onset   Cancer Father 36       bladder; non-smoker     Social History Social History   Tobacco Use   Smoking status: Former   Smokeless tobacco: Former    Types: Associate Professor Use: Never used  Substance Use Topics   Alcohol use: Yes    Alcohol/week: 1.0 - 40.0 standard drink of alcohol    Types: 1 - 40 Cans of beer per week    Comment: occ   Drug use: No     Allergies   Cephalexin and Sulfamethoxazole-trimethoprim   Review of Systems Review of Systems   Physical Exam Triage Vital Signs ED Triage Vitals [11/27/22 0856]  Enc Vitals Group     BP 124/70     Pulse Rate 63     Resp 16     Temp 97.7 F (36.5 C)     Temp Source Oral     SpO2 98 %     Weight      Height      Head Circumference      Peak Flow      Pain Score 2     Pain Loc      Pain Edu?      Excl. in GC?    No data found.  Updated Vital Signs BP 124/70 (BP Location: Left Arm)   Pulse 63  Temp 97.7 F (36.5 C) (Oral)   Resp 16   SpO2 98%   Visual Acuity Right Eye Distance:   Left Eye Distance:   Bilateral Distance:    Right Eye Near:   Left Eye Near:    Bilateral Near:     Physical Exam Vitals reviewed.  Constitutional:      General: He is not in acute distress.    Appearance: He is not toxic-appearing.  HENT:     Right Ear: Tympanic membrane and ear canal normal.     Left Ear: Tympanic membrane and ear canal normal.     Nose: Nose normal.     Mouth/Throat:     Mouth: Mucous membranes are moist.     Pharynx: No oropharyngeal exudate or posterior oropharyngeal erythema.  Eyes:     Extraocular Movements: Extraocular movements intact.     Conjunctiva/sclera: Conjunctivae normal.     Pupils: Pupils are equal, round, and reactive to light.     Comments: I cannot see any foreign body with the otoscope light shining on his left eye.  There is also no injection.  The lids are not swollen.  There is no discharge    Cardiovascular:     Rate and Rhythm: Normal rate and regular rhythm.     Heart sounds: No murmur  heard. Pulmonary:     Effort: Pulmonary effort is normal.     Breath sounds: Normal breath sounds.  Musculoskeletal:     Cervical back: Neck supple.  Lymphadenopathy:     Cervical: No cervical adenopathy.  Skin:    Capillary Refill: Capillary refill takes less than 2 seconds.     Coloration: Skin is not jaundiced or pale.  Neurological:     General: No focal deficit present.     Mental Status: He is alert and oriented to person, place, and time.  Psychiatric:        Behavior: Behavior normal.      UC Treatments / Results  Labs (all labs ordered are listed, but only abnormal results are displayed) Labs Reviewed - No data to display  EKG   Radiology No results found.  Procedures Procedures (including critical care time)  Medications Ordered in UC Medications - No data to display  Initial Impression / Assessment and Plan / UC Course  I have reviewed the triage vital signs and the nursing notes.  Pertinent labs & imaging results that were available during my care of the patient were reviewed by me and considered in my medical decision making (see chart for details).        Tetracaine drops were applied to the left eye and then fluorescein staining is done.  There is a tiny spot of uptake overlying his lateral iris, when viewed with the UV light.  The eye is then rinsed with eyewash, then I looked again with the light, and I do not see any foreign body overlying where the uptake was.  Gentamicin eyedrops are sent in Final Clinical Impressions(s) / UC Diagnoses   Final diagnoses:  Abrasion of left cornea, initial encounter     Discharge Instructions      Put gentamicin eyedrops in the affected eye(s) 3 times daily for 5 days.      ED Prescriptions     Medication Sig Dispense Auth. Provider   gentamicin (GARAMYCIN) 0.3 % ophthalmic solution Place 2 drops into the left eye 3 (three) times daily for 5 days. 5 mL Zenia Resides, MD  I have  reviewed the PDMP during this encounter.   Zenia Resides, MD 11/27/22 [phone]

## 2023-10-25 ENCOUNTER — Ambulatory Visit: Admission: EM | Admit: 2023-10-25 | Discharge: 2023-10-25 | Disposition: A | Discharge status: Home / Self Care

## 2023-10-25 ENCOUNTER — Encounter: Payer: Self-pay | Admitting: Emergency Medicine

## 2023-10-25 DIAGNOSIS — L237 Allergic contact dermatitis due to plants, except food: Secondary | ICD-10-CM | POA: Diagnosis not present

## 2023-10-25 DIAGNOSIS — E7800 Pure hypercholesterolemia, unspecified: Secondary | ICD-10-CM | POA: Insufficient documentation

## 2023-10-25 MED ORDER — PREDNISONE 10 MG (21) PO TBPK: ORAL_TABLET | Freq: Every day | ORAL | 0 refills | Status: AC

## 2023-10-25 NOTE — Discharge Instructions (Signed)
 I have sent you in prednisone to help alleviate rash.  Follow-up if any symptoms worsen.

## 2023-10-25 NOTE — ED Triage Notes (Signed)
 Pt states he has poison ivy from head to toe. Incident happened and pt noticed symptoms on Tuesday.

## 2023-10-25 NOTE — ED Provider Notes (Signed)
 [facility]    CSN: [id number] Arrival date & time: 10/25/23  0808      History   Chief Complaint Chief Complaint  Patient presents with   Poison Ivy    HPI Damon Richard is a 43 y.o. male.   Patient presents today given concern of rash due to poison ivy.  Reports he was working outside approximately 3 days ago when the rash occurred.  Patient is not reporting any feelings of throat closing or shortness of breath.  Denies any associated fever.  He has used calamine lotion.   Poison Ivy    Past Medical History:  Diagnosis Date   GERD (gastroesophageal reflux disease)    History of hiatal hernia     Patient Active Problem List   Diagnosis Date Noted   Hypercholesterolemia 10/25/2023   Pain in left knee 07/04/2019   Stiffness of left knee 07/04/2019   GERD 07/27/2009   RECTAL BLEEDING 07/27/2009   ABDOMINAL PAIN -GENERALIZED 07/27/2009   NONSPEC ABN FINDNG RAD & OTH EXAM ABDOMINAL AREA 07/27/2009    Past Surgical History:  Procedure Laterality Date   ANTERIOR CRUCIATE LIGAMENT REPAIR Left 08/14/2019   Procedure: RECONSTRUCTION ANTERIOR CRUCIATE LIGAMENT (ACL);  Surgeon: Juanell Fairly, MD;  Location: [facility] ORS;  Service: Orthopedics;  Laterality: Left;   WISDOM TOOTH EXTRACTION         Home Medications    Prior to Admission medications   Medication Sig Start Date End Date Taking? Authorizing Provider  mometasone (ELOCON) 0.1 % cream 1 application to affected area Externally Once a day as needed 05/05/20  Yes [provider]  predniSONE (STERAPRED UNI-PAK 21 TAB) 10 MG (21) TBPK tablet Take by mouth daily. Take 6 tabs by mouth daily  for 2 days, then 5 tabs for 2 days, then 4 tabs for 2 days, then 3 tabs for 2 days, 2 tabs for 2 days, then 1 tab by mouth daily for 2 days 10/25/23  Yes [place], [place] E, FNP  Cholecalciferol (VITAMIN D3) 25 MCG (1000 UT) CAPS 1 capsule Orally Once a day    [provider]  Cyanocobalamin (VITAMIN B  12 PO) Take 1 tablet by mouth as needed. TAKES SPORADICALLY    [provider]  cyanocobalamin 100 MCG tablet 1 tablet Orally once per day    [provider]  Ginger, Zingiber officinalis, (GINGER ROOT) 550 MG CAPS 1 capsule Orally once per day    [provider]  Omega-3 Fatty Acids (FISH OIL) 1000 MG CAPS 1 capsule Orally Once a day    [provider]  omeprazole (PRILOSEC) 20 MG capsule Take 20 mg by mouth as needed.     [provider]  omeprazole (PRILOSEC) 20 MG capsule 1 capsule Orally Once a day as needed    [provider]  VITAMIN D PO Take 1 tablet by mouth as needed. TAKES SPORADICALLY    [provider]    Family History Family History  Problem Relation Age of Onset   Cancer Father 40       bladder; non-smoker    Social History Social History   Tobacco Use   Smoking status: Former   Smokeless tobacco: Former    Types: Associate Professor status: Never Used  Substance Use Topics   Alcohol use: Yes    Alcohol/week: 1.0 - 40.0 standard drink of alcohol    Types: 1 - 40 Cans of beer per week  Comment: occ   Drug use: No     Allergies   Cephalexin, Sulfamethoxazole-trimethoprim, and Doxycycline   Review of Systems Review of Systems Per HPI  Physical Exam Triage Vital Signs ED Triage Vitals  Encounter Vitals Group     BP 10/25/23 0836 105/73     Systolic BP Percentile --      Diastolic BP Percentile --      Pulse Rate 10/25/23 0836 (!) 56     Resp 10/25/23 0836 16     Temp 10/25/23 0836 97.6 F (36.4 C)     Temp Source 10/25/23 0836 Oral     SpO2 10/25/23 0836 98 %     Weight 10/25/23 0835 225 lb 1.4 oz (102.1 kg)     Height 10/25/23 0835 6\' 4"  (1.93 m)     Head Circumference --      Peak Flow --      Pain Score 10/25/23 0835 0     Pain Loc --      Pain Education --      Exclude from Growth Chart --    No data found.  Updated Vital Signs BP 105/73 (BP Location: Left Arm)    Pulse (!) 56   Temp 97.6 F (36.4 C) (Oral)   Resp 16   Ht 6\' 4"  (1.93 m)   Wt 225 lb 1.4 oz (102.1 kg)   SpO2 98%   BMI 27.40 kg/m   Visual Acuity Right Eye Distance:   Left Eye Distance:   Bilateral Distance:    Right Eye Near:   Left Eye Near:    Bilateral Near:     Physical Exam Constitutional:      General: He is not in acute distress.    Appearance: Normal appearance. He is not toxic-appearing or diaphoretic.  HENT:     Head: Normocephalic and atraumatic.  Eyes:     Extraocular Movements: Extraocular movements intact.     Conjunctiva/sclera: Conjunctivae normal.  Pulmonary:     Effort: Pulmonary effort is normal.  Skin:    Comments: Patient has scattered erythematous lesions throughout upper extremities.  He reports that similar lesions are present to thighs. 1-2 lesions to right cheek.  Neurological:     General: No focal deficit present.     Mental Status: He is alert and oriented to person, place, and time. Mental status is at baseline.  Psychiatric:        Mood and Affect: Mood normal.        Behavior: Behavior normal.        Thought Content: Thought content normal.        Judgment: Judgment normal.      UC Treatments / Results  Labs (all labs ordered are listed, but only abnormal results are displayed) Labs Reviewed - No data to display  EKG   Radiology No results found.  Procedures Procedures (including critical care time)  Medications Ordered in UC Medications - No data to display  Initial Impression / Assessment and Plan / UC Course  I have reviewed the triage vital signs and the nursing notes.  Pertinent labs & imaging results that were available during my care of the patient were reviewed by me and considered in my medical decision making (see chart for details).     Rash is consistent with poison ivy dermatitis.  Will treat with prednisone steroid taper.  Encouraged patient to follow-up if symptoms persist or worsen.  No signs of  anaphylaxis on exam.  Patient verbalized understanding and was agreeable with plan. Final Clinical Impressions(s) / UC Diagnoses   Final diagnoses:  Poison ivy dermatitis     Discharge Instructions      I have sent you in prednisone to help alleviate rash.  Follow-up if any symptoms worsen.    ED Prescriptions     Medication Sig Dispense Auth. Provider   predniSONE (STERAPRED UNI-PAK 21 TAB) 10 MG (21) TBPK tablet Take by mouth daily. Take 6 tabs by mouth daily  for 2 days, then 5 tabs for 2 days, then 4 tabs for 2 days, then 3 tabs for 2 days, 2 tabs for 2 days, then 1 tab by mouth daily for 2 days 42 tablet [place], Acie Fredrickson, [place]      PDMP not reviewed this encounter.   Gustavus Bryant, [place] 10/25/23 [phone]

## 2023-11-27 ENCOUNTER — Other Ambulatory Visit: Payer: Self-pay

## 2023-11-27 ENCOUNTER — Encounter: Payer: Self-pay | Admitting: Emergency Medicine

## 2023-11-27 ENCOUNTER — Ambulatory Visit
Admission: EM | Admit: 2023-11-27 | Discharge: 2023-11-27 | Disposition: A | Discharge status: Home / Self Care | Attending: Family Medicine | Admitting: Family Medicine

## 2023-11-27 DIAGNOSIS — K2900 Acute gastritis without bleeding: Secondary | ICD-10-CM | POA: Diagnosis not present

## 2023-11-27 DIAGNOSIS — A09 Infectious gastroenteritis and colitis, unspecified: Secondary | ICD-10-CM | POA: Diagnosis present

## 2023-11-27 LAB — C DIFFICILE QUICK SCREEN W PCR REFLEX
C Diff antigen: NEGATIVE
C Diff interpretation: NOT DETECTED
C Diff toxin: NEGATIVE

## 2023-11-27 NOTE — ED Triage Notes (Signed)
 Pt here for diarrhea x 1 week; pt sts some fever last night

## 2023-11-27 NOTE — Discharge Instructions (Signed)
 Return stool sample prior to 8 PM today.  I will send sample out to the lab it typically takes 3 to 4 days for results to return.  Once results have retreatment can be prescribed.   Continue the Imodium to help with frequency of stools.  Continue to hydrate well with fluids.  Commend avoiding any dairy products or any foods rich in sugar as this can worsen GI symptoms.

## 2023-11-27 NOTE — ED Provider Notes (Signed)
 [facility]    CSN: [id number] Arrival date & time: 11/27/23  4098      History   Chief Complaint Chief Complaint  Patient presents with   Diarrhea    HPI Damon Richard is a 43 y.o. male.    Diarrhea Patient presents with a 2-day history of watery diarrhea stools occurring multiple times throughout the day.  Patient reports 1 episode of nausea about 2 days after the loose stool started but that has completely resolved.  He is not having any abdominal pain but reports as soon as he eats he is immediately running to the bathroom and has subsequent watery stools consecutively throughout the day.  He has not had a fever or chills.  He has no URI symptoms.  He reports that he did get new baby chickens which are living in his basement a few weeks ago as he is going to begin raising chickens and harvesting eggs.  He reports the chickens are living in the basement of his home while he is preparing and building a outdoor coop.  He reports that is the only recent change within his normal activities and is concerned that he has caught an illness from the chickens.  Past Medical History:  Diagnosis Date   GERD (gastroesophageal reflux disease)    History of hiatal hernia     Patient Active Problem List   Diagnosis Date Noted   Hypercholesterolemia 10/25/2023   Pain in left knee 07/04/2019   Stiffness of left knee 07/04/2019   GERD 07/27/2009   RECTAL BLEEDING 07/27/2009   ABDOMINAL PAIN -GENERALIZED 07/27/2009   NONSPEC ABN FINDNG RAD & OTH EXAM ABDOMINAL AREA 07/27/2009    Past Surgical History:  Procedure Laterality Date   ANTERIOR CRUCIATE LIGAMENT REPAIR Left 08/14/2019   Procedure: RECONSTRUCTION ANTERIOR CRUCIATE LIGAMENT (ACL);  Surgeon: Rande Bushy, MD;  Location: [facility] ORS;  Service: Orthopedics;  Laterality: Left;   WISDOM TOOTH EXTRACTION         Home Medications    Prior to Admission medications   Medication Sig Start Date End Date Taking?  Authorizing Provider  Cholecalciferol (VITAMIN D3) 25 MCG (1000 UT) CAPS 1 capsule Orally Once a day    [provider]  ciprofloxacin (CIPRO) 500 MG tablet Take 1 tablet (500 mg total) by mouth every 12 (twelve) hours for 7 days. 11/28/23 12/05/23  Vernestine Gondola, PA-C  Cyanocobalamin (VITAMIN B 12 PO) Take 1 tablet by mouth as needed. TAKES SPORADICALLY    [provider]  cyanocobalamin 100 MCG tablet 1 tablet Orally once per day    [provider]  Ginger, Zingiber officinalis, (GINGER ROOT) 550 MG CAPS 1 capsule Orally once per day    [provider]  mometasone (ELOCON) 0.1 % cream 1 application to affected area Externally Once a day as needed 05/05/20   [provider]  Omega-3 Fatty Acids (FISH OIL) 1000 MG CAPS 1 capsule Orally Once a day    [provider]  omeprazole (PRILOSEC) 20 MG capsule Take 20 mg by mouth as needed.     [provider]  omeprazole (PRILOSEC) 20 MG capsule 1 capsule Orally Once a day as needed    [provider]  predniSONE (STERAPRED UNI-PAK 21 TAB) 10 MG (21) TBPK tablet Take by mouth daily. Take 6 tabs by mouth daily  for 2 days, then 5 tabs for 2 days, then 4 tabs for 2 days, then 3 tabs for 2 days, 2 tabs for  2 days, then 1 tab by mouth daily for 2 days Patient not taking: Reported on 11/27/2023 10/25/23   Dodson Freestone, FNP  VITAMIN D PO Take 1 tablet by mouth as needed. TAKES SPORADICALLY    [provider]    Family History Family History  Problem Relation Age of Onset   Cancer Father 35       bladder; non-smoker    Social History Social History   Tobacco Use   Smoking status: Former   Smokeless tobacco: Former    Types: Associate Professor status: Never Used  Substance Use Topics   Alcohol use: Yes    Alcohol/week: 1.0 - 40.0 standard drink of alcohol    Types: 1 - 40 Cans of beer per week    Comment: occ   Drug use: No     Allergies   Cephalexin,  Sulfamethoxazole-trimethoprim, and Doxycycline   Review of Systems Review of Systems  Gastrointestinal:  Positive for diarrhea.     Physical Exam Triage Vital Signs ED Triage Vitals [11/27/23 1012]  Encounter Vitals Group     BP 126/81     Systolic BP Percentile      Diastolic BP Percentile      Pulse Rate 79     Resp 18     Temp 97.6 F (36.4 C)     Temp Source Oral     SpO2 99 %     Weight      Height      Head Circumference      Peak Flow      Pain Score 0     Pain Loc      Pain Education      Exclude from Growth Chart    No data found.  Updated Vital Signs BP 126/81 (BP Location: Left Arm)   Pulse 79   Temp 97.6 F (36.4 C) (Oral)   Resp 18   SpO2 99%   Visual Acuity Right Eye Distance:   Left Eye Distance:   Bilateral Distance:    Right Eye Near:   Left Eye Near:    Bilateral Near:     Physical Exam Vitals reviewed.  Constitutional:      Appearance: Normal appearance.  HENT:     Head: Normocephalic and atraumatic.  Eyes:     Extraocular Movements: Extraocular movements intact.     Pupils: Pupils are equal, round, and reactive to light.  Cardiovascular:     Rate and Rhythm: Normal rate and regular rhythm.  Pulmonary:     Effort: Pulmonary effort is normal.     Breath sounds: Normal breath sounds.  Abdominal:     General: Abdomen is flat. Bowel sounds are increased. There is no distension.     Tenderness: There is no abdominal tenderness. There is no guarding or rebound.  Musculoskeletal:        General: Normal range of motion.     Cervical back: Normal range of motion.  Skin:    General: Skin is warm and dry.  Neurological:     General: No focal deficit present.     Mental Status: He is alert and oriented to person, place, and time.      UC Treatments / Results  Labs (all labs ordered are listed, but only abnormal results are displayed) Labs Reviewed  GASTROINTESTINAL PANEL BY PCR, STOOL (REPLACES STOOL CULTURE) - Abnormal;  Notable for the following components:      Result  Value   Salmonella species DETECTED (*)    All other components within normal limits  C DIFFICILE QUICK SCREEN W PCR REFLEX    OVA + PARASITE EXAM  MISCELLANEOUS TEST    EKG   Radiology No results found.  Procedures Procedures (including critical care time)  Medications Ordered in UC Medications - No data to display  Initial Impression / Assessment and Plan / UC Course  I have reviewed the triage vital signs and the nursing notes.  Pertinent labs & imaging results that were available during my care of the patient were reviewed by me and considered in my medical decision making (see chart for details).    Suspect intestinal infection leading to acute gastritis.  Patient will return today with a stool sample, orders pending for an ova and parasite, C. difficile PCR, and gastrointestinal panel.  Patient advised I will defer treatment until stool sample results are available.  Our office will notify him of any abnormal results.  Continue to hydrate with fluids and fluids that contain electrolytes.  Patient is generally well-appearing and is not in any distress.  He verbalized understanding and agreement with plan today. Final Clinical Impressions(s) / UC Diagnoses   Final diagnoses:  Intestinal infection  Acute superficial gastritis, presence of bleeding unspecified     Discharge Instructions      Return stool sample prior to 8 PM today.  I will send sample out to the lab it typically takes 3 to 4 days for results to return.  Once results have retreatment can be prescribed.   Continue the Imodium to help with frequency of stools.  Continue to hydrate well with fluids.  Commend avoiding any dairy products or any foods rich in sugar as this can worsen GI symptoms.    ED Prescriptions   None    PDMP not reviewed this encounter.   Buena Carmine, NP 11/28/23 [phone]

## 2023-11-28 ENCOUNTER — Telehealth: Payer: Self-pay | Admitting: *Deleted

## 2023-11-28 ENCOUNTER — Encounter (HOSPITAL_COMMUNITY): Payer: Self-pay | Admitting: Family Medicine

## 2023-11-28 LAB — GASTROINTESTINAL PANEL BY PCR, STOOL (REPLACES STOOL CULTURE)

## 2023-11-28 MED ORDER — CIPROFLOXACIN HCL 500 MG PO TABS: 500.0000 mg | ORAL_TABLET | Freq: Two times a day (BID) | ORAL | 0 refills | Status: AC

## 2023-11-28 NOTE — Telephone Encounter (Signed)
 Call received from [facility] at [facility] lab. Pt stool sample is positive for Salmonella. Provider made aware in clinic

## 2023-11-28 NOTE — Telephone Encounter (Signed)
 Cipro prescribed to cover salmonella.

## 2023-11-28 NOTE — Telephone Encounter (Signed)
 Notified patient of same. Reports he thinks infection was acquired from preparing chicken at home.

## 2023-12-04 LAB — OVA + PARASITE EXAM

## 2023-12-04 LAB — O&P RESULT

## 2023-12-18 LAB — MISCELLANEOUS TEST

## 2024-05-22 ENCOUNTER — Ambulatory Visit
Admission: EM | Admit: 2024-05-22 | Discharge: 2024-05-22 | Disposition: A | Discharge status: Home / Self Care | Attending: Family Medicine | Admitting: Family Medicine

## 2024-05-22 ENCOUNTER — Other Ambulatory Visit: Payer: Self-pay

## 2024-05-22 DIAGNOSIS — S21211A Laceration without foreign body of right back wall of thorax without penetration into thoracic cavity, initial encounter: Secondary | ICD-10-CM

## 2024-05-22 MED ORDER — TETANUS-DIPHTH-ACELL PERTUSSIS 5-2-15.5 LF-MCG/0.5 IM SUSP
0.5000 mL | Freq: Once | INTRAMUSCULAR | Status: AC
  Administered 2024-05-22: 0.5 mL via INTRAMUSCULAR

## 2024-05-22 MED ORDER — MUPIROCIN 2 % EX OINT: 1.0000 | TOPICAL_OINTMENT | Freq: Two times a day (BID) | CUTANEOUS | 0 refills | Status: AC

## 2024-05-22 NOTE — ED Provider Notes (Signed)
  [facility]   [id number] 05/22/24 Arrival Time: 1859  ASSESSMENT & PLAN:  1. Laceration of right side of back, initial encounter    Deep gouge of skin. No sutures necessary.    Discharge Instructions       Meds ordered this encounter  Medications   Tdap (ADACEL) injection 0.5 mL   mupirocin ointment (BACTROBAN) 2 %    Sig: Apply 1 Application topically 2 (two) times daily.    Dispense:  22 g    Refill:  0      Simple wound care.  Reviewed expectations re: course of current medical issues. Questions answered. Outlined signs and symptoms indicating need for more acute intervention. Patient verbalized understanding. After Visit Summary given.   SUBJECTIVE:  Damon Richard is a 43 y.o. male who presents with a laceration of R lower back. Cut on metal object. Minimal bleeding. Needs Td.   OBJECTIVE:  Vitals:   05/22/24 1909  BP: 128/78  Pulse: (!) 101  Resp: 18  Temp: 98.1 F (36.7 C)  TempSrc: Oral  SpO2: 94%     General appearance: alert; no distress Skin: linear deep skin avulsion of lower R back; size: approx 4 cm; clean wound edges, no foreign bodies; without active bleeding Psychological: alert and cooperative; normal mood and affect    Labs Reviewed - No data to display  No results found.  Allergies  Allergen Reactions   Cephalexin  Rash    Had been on Keflex  and Bactrim  at the same time, developed a rash Had been on Keflex  and Bactrim  at the same time, developed a rash    Sulfamethoxazole -Trimethoprim  Rash    Patient on Keflex  and Bactrim  at the same time, developed rash Patient on Keflex  and Bactrim  at the same time, developed rash    Doxycycline Hives    Past Medical History:  Diagnosis Date   GERD (gastroesophageal reflux disease)    History of hiatal hernia    Social History   Socioeconomic History   Marital status: Married    Spouse name: Tobias   Number of children: 0   Years of education: 16   Highest  education level: Not on file  Occupational History   Occupation: beer sales  Tobacco Use   Smoking status: Former   Smokeless tobacco: Former    Types: Engineer, drilling   Vaping status: Never Used  Substance and Sexual Activity   Alcohol use: Yes    Alcohol/week: 1.0 - 40.0 standard drink of alcohol    Types: 1 - 40 Cans of beer per week    Comment: occ   Drug use: No   Sexual activity: Yes    Partners: Female    Birth control/protection: Other-see comments    Comment: partner takes birth control pills  Other Topics Concern   Not on file  Social History Narrative   Lives with girlfriend.   Social Drivers of Corporate investment banker Strain: Not on file  Food Insecurity: Not on file  Transportation Needs: Not on file  Physical Activity: Not on file  Stress: Not on file  Social Connections: Not on file          Rolinda Rogue, MD 05/22/24 [phone]

## 2024-05-22 NOTE — Discharge Instructions (Signed)
 Meds ordered this encounter  Medications   Tdap (ADACEL) injection 0.5 mL   mupirocin ointment (BACTROBAN) 2 %    Sig: Apply 1 Application topically 2 (two) times daily.    Dispense:  22 g    Refill:  0

## 2024-05-22 NOTE — ED Triage Notes (Signed)
 Laceration to right side low back. States he cut it on a cast iron dinner bell that was in the yard when he was doing yardwork today around 230-3pm. Bleeding controlled. He states he is here for a tetanus shot
